# Patient Record
Sex: Female | Born: 1960 | Race: White | Hispanic: No | Marital: Single | Smoking: Current every day smoker
Health system: Southern US, Community
[De-identification: ages and names within clinical notes are randomized; demographics above are authoritative.]

## PROBLEM LIST (undated history)

## (undated) DIAGNOSIS — R739 Hyperglycemia, unspecified: Secondary | ICD-10-CM

## (undated) DIAGNOSIS — R0602 Shortness of breath: Secondary | ICD-10-CM

## (undated) DIAGNOSIS — E7800 Pure hypercholesterolemia, unspecified: Secondary | ICD-10-CM

## (undated) DIAGNOSIS — J40 Bronchitis, not specified as acute or chronic: Secondary | ICD-10-CM

## (undated) DIAGNOSIS — J449 Chronic obstructive pulmonary disease, unspecified: Secondary | ICD-10-CM

## (undated) HISTORY — DX: Shortness of breath: R06.02

## (undated) HISTORY — DX: Pure hypercholesterolemia, unspecified: E78.00

## (undated) HISTORY — DX: Hyperglycemia, unspecified: R73.9

## (undated) HISTORY — PX: SHOULDER SURGERY: SHX246

---
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
## 1998-05-22 ENCOUNTER — Other Ambulatory Visit: Admission: RE | Admit: 1998-05-22 | Discharge: 1998-05-22 | Payer: Self-pay | Admitting: Orthopaedic Surgery

## 1999-02-16 ENCOUNTER — Encounter: Payer: Self-pay | Admitting: Internal Medicine

## 1999-02-16 ENCOUNTER — Ambulatory Visit (HOSPITAL_COMMUNITY): Admission: RE | Admit: 1999-02-16 | Discharge: 1999-02-16 | Payer: Self-pay | Admitting: Internal Medicine

## 1999-02-17 ENCOUNTER — Ambulatory Visit (HOSPITAL_COMMUNITY): Admission: RE | Admit: 1999-02-17 | Discharge: 1999-02-17 | Payer: Self-pay | Admitting: Internal Medicine

## 1999-07-06 ENCOUNTER — Other Ambulatory Visit: Admission: RE | Admit: 1999-07-06 | Discharge: 1999-07-06 | Payer: Self-pay | Admitting: Obstetrics and Gynecology

## 2001-12-21 ENCOUNTER — Other Ambulatory Visit: Admission: RE | Admit: 2001-12-21 | Discharge: 2001-12-21 | Payer: Self-pay | Admitting: Obstetrics and Gynecology

## 2002-12-21 ENCOUNTER — Encounter: Admission: RE | Admit: 2002-12-21 | Discharge: 2002-12-21 | Payer: Self-pay | Admitting: Family Medicine

## 2002-12-21 ENCOUNTER — Encounter: Payer: Self-pay | Admitting: Family Medicine

## 2003-01-23 ENCOUNTER — Other Ambulatory Visit: Admission: RE | Admit: 2003-01-23 | Discharge: 2003-01-23 | Payer: Self-pay | Admitting: Obstetrics and Gynecology

## 2003-04-27 HISTORY — PX: COLONOSCOPY: SHX174

## 2004-01-27 ENCOUNTER — Other Ambulatory Visit: Admission: RE | Admit: 2004-01-27 | Discharge: 2004-01-27 | Payer: Self-pay | Admitting: Obstetrics and Gynecology

## 2004-03-03 ENCOUNTER — Ambulatory Visit: Payer: Self-pay | Admitting: Family Medicine

## 2004-03-05 ENCOUNTER — Ambulatory Visit: Payer: Self-pay | Admitting: Family Medicine

## 2004-03-09 ENCOUNTER — Encounter: Admission: RE | Admit: 2004-03-09 | Discharge: 2004-03-09 | Payer: Self-pay | Admitting: Family Medicine

## 2004-08-12 ENCOUNTER — Ambulatory Visit: Payer: Self-pay | Admitting: Internal Medicine

## 2004-08-31 ENCOUNTER — Ambulatory Visit: Payer: Self-pay | Admitting: Internal Medicine

## 2004-09-02 ENCOUNTER — Inpatient Hospital Stay (HOSPITAL_COMMUNITY): Admission: EM | Admit: 2004-09-02 | Discharge: 2004-09-12 | Payer: Self-pay | Admitting: Emergency Medicine

## 2004-09-03 ENCOUNTER — Encounter (INDEPENDENT_AMBULATORY_CARE_PROVIDER_SITE_OTHER): Payer: Self-pay | Admitting: Specialist

## 2004-09-03 ENCOUNTER — Ambulatory Visit: Payer: Self-pay | Admitting: Internal Medicine

## 2005-02-12 ENCOUNTER — Encounter (INDEPENDENT_AMBULATORY_CARE_PROVIDER_SITE_OTHER): Payer: Self-pay | Admitting: Specialist

## 2005-02-12 ENCOUNTER — Inpatient Hospital Stay (HOSPITAL_COMMUNITY): Admission: RE | Admit: 2005-02-12 | Discharge: 2005-02-17 | Payer: Self-pay | Admitting: General Surgery

## 2005-04-13 ENCOUNTER — Other Ambulatory Visit: Admission: RE | Admit: 2005-04-13 | Discharge: 2005-04-13 | Payer: Self-pay | Admitting: Obstetrics and Gynecology

## 2005-06-08 ENCOUNTER — Encounter: Admission: RE | Admit: 2005-06-08 | Discharge: 2005-06-08 | Payer: Self-pay | Admitting: General Surgery

## 2005-06-14 ENCOUNTER — Encounter (INDEPENDENT_AMBULATORY_CARE_PROVIDER_SITE_OTHER): Payer: Self-pay | Admitting: *Deleted

## 2005-06-14 ENCOUNTER — Other Ambulatory Visit: Admission: RE | Admit: 2005-06-14 | Discharge: 2005-06-14 | Payer: Self-pay | Admitting: Interventional Radiology

## 2005-06-14 ENCOUNTER — Encounter: Admission: RE | Admit: 2005-06-14 | Discharge: 2005-06-14 | Payer: Self-pay | Admitting: General Surgery

## 2006-06-28 ENCOUNTER — Ambulatory Visit (HOSPITAL_COMMUNITY): Admission: RE | Admit: 2006-06-28 | Discharge: 2006-06-29 | Payer: Self-pay | Admitting: General Surgery

## 2006-06-28 ENCOUNTER — Encounter (INDEPENDENT_AMBULATORY_CARE_PROVIDER_SITE_OTHER): Payer: Self-pay | Admitting: Specialist

## 2007-11-06 ENCOUNTER — Encounter: Admission: RE | Admit: 2007-11-06 | Discharge: 2007-11-06 | Payer: Self-pay | Admitting: General Surgery

## 2008-03-11 DIAGNOSIS — J069 Acute upper respiratory infection, unspecified: Secondary | ICD-10-CM | POA: Insufficient documentation

## 2008-03-13 ENCOUNTER — Ambulatory Visit: Payer: Self-pay | Admitting: Family Medicine

## 2008-03-13 DIAGNOSIS — F17200 Nicotine dependence, unspecified, uncomplicated: Secondary | ICD-10-CM | POA: Insufficient documentation

## 2008-03-13 DIAGNOSIS — J45909 Unspecified asthma, uncomplicated: Secondary | ICD-10-CM | POA: Insufficient documentation

## 2008-03-13 LAB — CONVERTED CEMR LAB
ALT: 20 units/L (ref 0–35)
Albumin: 3.8 g/dL (ref 3.5–5.2)
Alkaline Phosphatase: 78 units/L (ref 39–117)
Basophils Absolute: 0 10*3/uL (ref 0.0–0.1)
Calcium: 10 mg/dL (ref 8.4–10.5)
Chloride: 107 meq/L (ref 96–112)
Cholesterol: 279 mg/dL (ref 0–200)
Creatinine, Ser: 1 mg/dL (ref 0.4–1.2)
Eosinophils Absolute: 0.1 10*3/uL (ref 0.0–0.7)
GFR calc non Af Amer: 63 mL/min
Glucose, Bld: 107 mg/dL — ABNORMAL HIGH (ref 70–99)
HCT: 41.6 % (ref 36.0–46.0)
HDL: 67.4 mg/dL (ref >39.0)
MCHC: 34.5 g/dL (ref 30.0–36.0)
Monocytes Absolute: 0.7 10*3/uL (ref 0.1–1.0)
Monocytes Relative: 6.3 % (ref 3.0–12.0)
Sodium: 146 meq/L — ABNORMAL HIGH (ref 135–145)
TSH: 1.4 microintl units/mL (ref 0.35–5.50)
Total Bilirubin: 0.5 mg/dL (ref 0.3–1.2)
Total CHOL/HDL Ratio: 4.1
Triglycerides: 152 mg/dL — ABNORMAL HIGH (ref 0–149)
VLDL: 30 mg/dL (ref 0–40)
WBC: 10.4 10*3/uL (ref 4.5–10.5)

## 2008-03-14 ENCOUNTER — Telehealth: Payer: Self-pay | Admitting: Family Medicine

## 2008-03-27 ENCOUNTER — Other Ambulatory Visit: Admission: RE | Admit: 2008-03-27 | Discharge: 2008-03-27 | Payer: Self-pay | Admitting: Family Medicine

## 2008-03-27 ENCOUNTER — Encounter: Payer: Self-pay | Admitting: Family Medicine

## 2008-03-27 ENCOUNTER — Ambulatory Visit: Payer: Self-pay | Admitting: Family Medicine

## 2008-03-27 DIAGNOSIS — N951 Menopausal and female climacteric states: Secondary | ICD-10-CM | POA: Insufficient documentation

## 2008-04-04 ENCOUNTER — Encounter: Admission: RE | Admit: 2008-04-04 | Discharge: 2008-04-04 | Payer: Self-pay | Admitting: Family Medicine

## 2008-10-04 ENCOUNTER — Ambulatory Visit: Payer: Self-pay | Admitting: Family Medicine

## 2008-10-04 ENCOUNTER — Telehealth: Payer: Self-pay | Admitting: Internal Medicine

## 2008-10-04 DIAGNOSIS — K210 Gastro-esophageal reflux disease with esophagitis: Secondary | ICD-10-CM

## 2009-08-22 ENCOUNTER — Encounter: Admission: RE | Admit: 2009-08-22 | Discharge: 2009-08-22 | Payer: Self-pay | Admitting: Internal Medicine

## 2009-08-29 ENCOUNTER — Encounter: Admission: RE | Admit: 2009-08-29 | Discharge: 2009-08-29 | Payer: Self-pay | Admitting: Internal Medicine

## 2009-09-17 ENCOUNTER — Encounter (INDEPENDENT_AMBULATORY_CARE_PROVIDER_SITE_OTHER): Payer: Self-pay | Admitting: *Deleted

## 2010-05-28 NOTE — Letter (Signed)
Summary: Colonoscopy-Changed to Office Visit Letter  [facility]  [address] [place], [place] [address]   Phone: [phone]  Fax: [phone]      Sep 17, 2009 MRN: [id number]   [name] [address] [place], [place]  [address]   Dear Ms. Krotzer,   According to our records, it is time for you to schedule a Colonoscopy. However, after reviewing your medical record, I feel that an office visit would be most appropriate to more completely evaluate you and determine your need for a repeat procedure.  Please call [phone] (option #2) at your convenience to schedule an office visit. If you have any questions, concerns, or feel that this letter is in error, we would appreciate your call.   Sincerely,  Iva Boop, M.D.  [facility] [phone]

## 2010-09-11 NOTE — Op Note (Signed)
[name], Arias NO.:  [id number]   MEDICAL RECORD NO.:  [id number]          PATIENT TYPE:  INP   LOCATION:  0355                         FACILITY:  [facility]   PHYSICIAN:  Adolph Pollack, M.D.DATE OF BIRTH:  24-Jul-1960   DATE OF PROCEDURE:  09/03/2004  DATE OF DISCHARGE:                                 OPERATIVE REPORT   PREOPERATIVE DIAGNOSIS:  Perforated colon, post colonoscopy.   POSTOPERATIVE DIAGNOSIS:  Perforated sigmoid colon.   OPERATION/PROCEDURE:  1.  Exploratory laparotomy.  2.  Partial colectomy (sigmoid colon).  3.  Colostomy.   SURGEON:  Adolph Pollack, M.D.   ASSISTANT:  Angelia Mould. Derrell Lolling, M.D.   ANESTHESIA:  General endotracheal anesthesia.   INDICATIONS:  Sheena Arias is a 50 year old female who underwent  colonoscopy approximately three days ago with a sigmoid polypectomy.  She  began having a little crampy pain Tuesday and had a bowel movement Tuesday  night.  The pain escalated Wednesday and was quite severe.  She presented to  the emergency room and was admitted Wednesday evening.  CT scan was done in  the evening.  It demonstrates some extraluminal air and a complex abscess  cavity, potentially consistent with fecal material.  She is developing  worsening abdominal symptoms and elevation of white count.  She is now  brought to the operating room for emergency exploratory laparotomy.  The  procedure and risks were discussed with her preoperatively.   DESCRIPTION OF PROCEDURE:  She was seen in the holding area and brought to  the operating room, placed supine on the operating table and a general  anesthesia was administered.  Foley catheter was placed in the bladder.  The  abdominal wall was sterilely prepped and draped.  A subumbilical midline  incision was made through the skin and subcutaneous tissue, fascia and  peritoneum.  The peritoneal cavity was entered.  Once the peritoneal cavity  was entered, cloudy fluid was  noted and sent for culture.  This was coming  out of the pelvis.  I then mobilized part of the small bowel and found that  there was a large perforation, approximately 50% circumferential, in the  sigmoid colon with heavy stool contamination contained in the pelvis.  I  mobilized the sigmoid colon, both proximally and distally.  I then divided  the sigmoid colon proximally and distally to the perforation with stapler.  The mesentery was divided between vessels.  Dissection was kept above the  plane of the ureter.  Specimen was handed off the field.  Following this, I  copiously irrigated out the abdominal cavity and removed stool and debris.  Again, it was confined to the pelvic region.  Some of the small bowel had  helped wall it off.  I debrided part of the stool and some of the fibrinous  debris from the small bowel.  There is no small bowel perforation present.   After this, gloves were changed.  The area was inspected again and I did not  see any further feculent debris present.  Using a 2-0 Prolene suture, I  anchored the rectal  stump to the posterior abdominal wall.  I subsequently  made a circular incision in the left lower quadrant through the skin and  subcutaneous tissue.  A cruciate incision was made in the anterior and  posterior fascial layers.  The proximal descending colon stump was then  brought up through this circular incision and fascial defect for colostomy  placement and anchored the anterior aspect of the colon to the fascia with  interrupted 2-0 Vicryl sutures.   Sponge, needle and instrument counts were reported to be correct.  I took  the omentum and placed it over the intestine.  The fascia was then closed  with a running #1 PDS suture.  I left the subcutaneous tissue open mostly,  putting a few staples in the mid portion of the wound and at the proximal  and distal end and packed it with moist gauze.  The colostomy was then  matured with interrupted 3-0 Vicryl  sutures.  Colostomy appliance was  applied.  A bulky dry dressing was applied over the midline wound.   She tolerated the procedure fairly well without any apparent complications.  She subsequently was taken to the recovery room in satisfactory condition.      TJR/MEDQ  D:  09/03/2004  T:  09/03/2004  Job:  [id number]   cc:   Lina Sar, M.D. [facility]. Tawanna Cooler, M.D. [facility]

## 2010-09-11 NOTE — H&P (Signed)
NAME:  Sheena Arias, Sheena Arias NO.:  [id number]   MEDICAL RECORD NO.:  [id number]          PATIENT TYPE:  EMS   LOCATION:  ED                           FACILITY:  [facility]   PHYSICIAN:  Lina Sar, M.D. [facility]  DATE OF BIRTH:  July 16, 1960   DATE OF ADMISSION:  09/02/2004  DATE OF DISCHARGE:                                HISTORY & PHYSICAL   CHIEF COMPLAINT:  Severe abdominal pain.   HISTORY:  Taila is a pleasant 50 year old white female with history of colon  polyps and family history of colon cancer who is otherwise in generally good  health.  She has had no prior abdominal surgery.  She is status post right  shoulder repair and right foot surgery.   Patient underwent follow-up colonoscopy with Dr. Juanda Chance on Aug 31, 2004 and  had a small sigmoid colon polyp removed via hot biopsy.  She did fine with  the procedure and felt fine post procedure.  She had no complaints of  abdominal pain yesterday, had gone to work, eaten, etc.  Says she had some  very minimal crampy discomfort which she expected.  This morning after she  got up she developed acute severe lower abdominal pain and in the suprapubic  area and left lower quadrant which doubled her over.  This pain persisted.  Is described as severe 10/10, is associated with nausea and apparently  vomiting x1.  She has not had any fever or chills, no melena or hematochezia  or bowel movement this morning.  She was advised to present to the emergency  room for further evaluation.  She is hemodynamically stable, still having  severe pain which she describes as constant, gripey, and crampy in nature.  Her KUB shows a small bowel ileus, no free air.  WBC is 10.2, H&H 13.5 and  38.9, platelets 234.  Sodium 138, potassium 3.3, creatinine 0.9, LFTs  normal.  UA is pending.  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   CURRENT MEDICATIONS:  None on a  regular basis.   ALLERGIES:  IODINE and POTASSIUM IODIDE with itching and congestion.   PAST MEDICAL HISTORY:  As outlined above.   FAMILY HISTORY:  Mother deceased with colon cancer.   SOCIAL HISTORY:  The patient is married.  She is employed.  She is a smoker  one pack per day.  No regular ETOH.   REVIEW OF SYSTEMS:  CARDIOVASCULAR:  Denies any chest pain or anginal  symptoms.  PULMONARY:  Denies any cough, shortness of breath, or sputum  production.  GENITOURINARY:  She does describe pressure with urination this  morning.  She has no history of ureterolithiasis.  No hematuria noted.  GI:  As above.   PHYSICAL EXAMINATION:  GENERAL:  Well-developed white female in pain.  She  is quite uncomfortable.  She is alert and oriented x3.  VITAL SIGNS:  Temperature 96.9, blood pressure 127/85, pulse in the 90s,  respirations 16, saturations 99 on room air.  HEENT:  Normocephalic, atraumatic.  EOMI.  PERRLA.  Sclerae anicteric.  CARDIOVASCULAR:  Regular rate and rhythm with S1 and S2.  No murmurs, rubs,  or gallops.  PULMONARY:  Clear to A&P.  ABDOMEN:  Tender exquisitely in the left lower quadrant and suprapubic  region with guarding and early rebound.  She is minimally distended.  Bowel  sounds are active.  There is no mass or hepatosplenomegaly.  RECTAL:  Not done at this time.  EXTREMITIES:  Without clubbing, cyanosis, edema.  Pulses are intact.  NEUROLOGIC:  Nonfocal.   IMPRESSION:  A 50 year old white female with acute severe abdominal pain 48  hours status post colonoscopy and polypectomy.  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  Rule out focal perforation.  Rule out  ureterolithiasis.   PLAN:  Patient is admitted to the service of Dr. Lina Sar for IV fluid  hydration, pain control, CT scan of the abdomen and pelvis now.  She will be  kept at bowel rest.  Cover her with IV Unasyn.  For further details please  see the orders.      AE/MEDQ  D:   09/02/2004  T:  09/02/2004  Job:  [id number]

## 2010-09-11 NOTE — Discharge Summary (Signed)
Sheena Arias, Sheena Arias              ACCOUNT NO.:  [id number]   MEDICAL RECORD NO.:  [id number]          PATIENT TYPE:  INP   LOCATION:  1617                         FACILITY:  [facility]   PHYSICIAN:  Adolph Pollack, M.D.DATE OF BIRTH:  1961/02/21   DATE OF ADMISSION:  02/12/2005  DATE OF DISCHARGE:  02/17/2005                                 DISCHARGE SUMMARY   PRINCIPLE DISCHARGE DIAGNOSIS:  Colostomy.   SECONDARY DIAGNOSES:  1.  Gastroesophageal reflux disease.  2.  Mild postoperative ileus.   PROCEDURE:  Laparoscopic-assisted colostomy closure.   INDICATIONS:  This 50 year old female had an emergency partial colectomy and  colostomy for perforated colon.  She was recovering completely and now  presents for colostomy closure.   HOSPITAL COURSE:  She underwent the laparoscopic-assisted segmental  colectomy and colostomy on February 12, 2005 and tolerated this fairly well.  She had a little bit of nausea and a mild postoperative ileus; however, she  began passing gas in her third postoperative day.  Her diet was advanced.  Bowel are moving better.  By her fifth postoperative day, she was having  stable GI function.  The wounds looked good.  She was able to be discharged.   DISPOSITION:  Discharge to home on February 17, 2005.  The trocar site and  staples were removed.  Steri-Strips were applied.  Staples were left in the  left-sided colostomy closure site.  She was given activity restrictions and  Tylox for pain.  Will come back and see me in the office in about one week.      Adolph Pollack, M.D.  Electronically Signed     TJR/MEDQ  D:  02/24/2005  T:  02/24/2005  Job:  [id number]   cc:   Lina Sar, M.D. [facility]  [address]  [place]  [place] [address]

## 2010-09-11 NOTE — Op Note (Signed)
NAME:  Sheena Arias, MATZEN              ACCOUNT NO.:  [id number]   MEDICAL RECORD NO.:  [id number]          PATIENT TYPE:  INP   LOCATION:  0001                         FACILITY:  [facility]   PHYSICIAN:  Adolph Pollack, M.D.DATE OF BIRTH:  1960-05-05   DATE OF PROCEDURE:  02/12/2005  DATE OF DISCHARGE:                                 OPERATIVE REPORT   PREOPERATIVE DIAGNOSIS:  Colostomy.   POSTOPERATIVE DIAGNOSIS:  Colostomy.   PROCEDURE:  Laparoscopic-assisted segmental colectomy and colostomy closure.   SURGEON:  Adolph Pollack, M.D.   ASSISTANT:  Thornton Park. Daphine Deutscher, MD   ANESTHESIA:  General.   INDICATIONS:  Sheena Arias is a 50 year old female status post emergency  sigmoid colectomy and colostomy for sigmoid colon perforation. This was back  in May of this year. She has recovered completely, her wounds have healed.  She is at normal strength now and she presents for colostomy closure. The  procedure and the risks were discussed with her preoperatively.   TECHNIQUE:  She was seen in the holding area and then brought to the  operating room, placed supine on the operating table and a general  anesthetic was administered. A Foley catheter was placed in the bladder. She  was placed in lithotomy position. The abdominal wall, perineum and colostomy  were then sterilely prepped and draped. A Betadine-soaked sponge was placed  on the colostomy followed by an occlusive dressing. A small incision was  made in the right upper quadrant and using a 10 mm Optiview port the  peritoneal cavity was entered. The CO2 gas was insufflated creating a  pneumoperitoneum. The laparoscope was then reduced. I saw no underlying  visceral injury underneath the trocar and no bleeding was noted. Adhesions  between the omentum and anterior abdominal wall were noted. A 10 mm trocar  was placed in the right lower quadrant and a  5 mm trocar placed in the  right mid abdomen. Using the harmonic scalpel, I  lysed adhesions between the  omentum and anterior abdominal wall and was able to expose the colostomy.  Some small bowel adhesions to the colostomy area and the descending colon  were then divided sharply dropping the small bowel down into the peritoneal  cavity. I then identified suture that marked the distal sigmoid/colon/rectal  stump. There were some small bowel adhesions to this which I divided  sharply. It was fairly adherent to the pelvic sidewall. However, the  colostomy and the colonic stump were fairly close together. At this time, I  desufflated the abdomen. I then made an elliptical incision with a lateral  extension around the colostomy, divided the subcutaneous tissue down to the  fascia and then dissected the colostomy and the segment of colon free from  the fascia. I was able to mobilize quite a distance of it. I then directed  my attention to the rectal/sigmoid colon stump. I divided some adhesions  medially. It was fairly stuck to the pelvic sidewall and I carefully lysed  adhesions here. The gonadal vessels were adherent to the side of the  colostomy and I dissected them on the  colon and clipped and divided them. I  kept my dissection above the plane of the ureter. I noticed a small seroma  that was present, it was somewhat gelatinous in the area and I evacuated  this. This allowed for enough mobilization of the stump to perform an end-to-  end hand sewn anastomosis without tension. I performed a segmental colectomy  by sharply dividing the colon and dividing the mesentery between clamps and  ligating the vessels and handed this specimen off the field.   An end-to-end hand sewn anastomosis was performed in a single layer with  interrupted silk sutures, inverting the anastomosis. The anastomosis was  patent, viable, and under no tension. I then irrigated out the abdominal  cavity and the fluid returned clear without evidence of bleeding. I applied  Tisseel to the  anastomosis and let it dry and I placed the anastomosis back  into the abdominal cavity. I was told that needle, sponge and instrument  counts were correct. I then closed the colostomy site fascia in two layers  closing the posterior fascial layer with running #1 PDS suture and the  anterior fascial layer with #1 PDS suture. I irrigated out the subcutaneous  tissue. I then loosely closed the skin with staples and placed a little  Telfa gauze in between the staples.   I then reinsufflated the abdomen and inspected the area and noted no  bleeding or enteric leakage. I removed the trocars in the right abdomen  area. I then closed these incisions with staples. Sterile dressings were  applied. She tolerated the procedure well without any apparent  complications. Estimated blood loss was about 200 mL. She subsequently was  taken to the recovery in satisfactory condition.      Adolph Pollack, M.D.  Electronically Signed     TJR/MEDQ  D:  02/12/2005  T:  02/12/2005  Job:  [id number]   cc:   Lina Sar, M.D. [facility]  [address]  [place]  [place] [address]   Tinnie Gens A. Tawanna Cooler, M.D. [facility]  [address] [place]  [place] [address]

## 2010-09-11 NOTE — Consult Note (Signed)
NAMENORLENE, LANES NO.:  [id number]   MEDICAL RECORD NO.:  [id number]          PATIENT TYPE:  INP   LOCATION:  0355                         FACILITY:  [facility]   PHYSICIAN:  Adolph Pollack, M.D.DATE OF BIRTH:  [birth year]   DATE OF CONSULTATION:  09/03/2004  DATE OF DISCHARGE:                                   CONSULTATION   REASON FOR CONSULTATION:  Possible colonic perforation following  colonoscopy.   HISTORY OF PRESENT ILLNESS:  This is a 50 year old female, has a family  history of colon cancer and 3 years ago, had a colonoscopy with polypectomy.  She presents now for follow up colonoscopy and had a sigmoid polypectomy.  This was done 3 days ago.  Two days ago, she had some crampy left lower  quadrant pain, and yesterday she started having severe left lower quadrant  and suprapubic pain that progressively worsened.  She subsequently was  admitted to the hospital.  She had one episode of vomiting.  She has been  treated with IV antibiotics overnight, but her symptoms have worsened.  She  is more distended, and her white cell count is rising.  A CT scan was done  yesterday which demonstrated extraluminal area in the area of the sigmoid  colon as well as complex fluid mass, consistent with possible fecal  material.  Because of the concern of colonic perforation and her worsening  clinical status, I was asked to see her.  She does state that the pain is  getting worse and beginning to radiate more.   PAST MEDICAL HISTORY:  1.  Herniated disks of lumbar spine.  2.  Colonic polyps.   PAST SURGICAL HISTORY:  1.  Right shoulder surgery.  2.  Right foot surgery.   DRUG ALLERGIES:  IODINE AND POTASSIUM IODIDE.   HOME MEDICATIONS:  None.   SOCIAL HISTORY:  Married and employed.  She smokes a pack of cigarettes a  day.  No alcohol use.   FAMILY HISTORY:  Positive for a mother with colon cancer who died from it.   REVIEW OF SYSTEMS:  CONSTITUTIONAL:   Otherwise, in fairly good health until  she got ill at this time.  CARDIOVASCULAR:  Denies any hypertension, cardiac  disease.  PULMONARY:  Denies pneumonia, asthma, tuberculosis.  GI:  She had  a small bowel movement on Tuesday, and that is when the pain began actually  getting worse.  She is more distended now.  GU:  No known kidney stones.  ENDOCRINE:  No diabetes, thyroid disease, hypercholesterolemia.  NEUROLOGIC:  No strokes or seizures.  HEMATOLOGIC:  No bleeding disorders, blood clots.   PHYSICAL EXAMINATION:  GENERAL:  Generally an ill and uncomfortable-  appearing female.  VITAL SIGNS:  Temperature at 6:00 this morning was 97 degrees.  Blood  pressure 114/77.  Pulse is 95 and has been rising.  O2 saturations 96% on  room air.  EYES:  Extraocular muscles are intact, no icterus.  NECK:  Supple without masses or obvious thyroid enlargement.  RESPIRATORY:  Breath sounds equal and clear.  Respirations unlabored.  CARDIOVASCULAR:  Demonstrates  a regular rate and rhythm.  No murmur heard.  No lower extremity edema.  No JVD.  ABDOMEN:  Soft but distended.  There is lower abdominal tenderness and  guarding bilaterally to palpation and percussion.  No obvious masses.  No  hernias.  MUSCULOSKELETAL:  She has full range of motion.  She does walk a bit hunched  over, holding her abdomen.   LABORATORY REVIEW:  White cell count upon admission was 10,000.  It is now  16,300 this morning with a leftward shift.  Electrolytes within normal  limits except for a glucose of 149.  Liver function tests within normal  limits.   CT scan was reviewed and demonstrates an extraluminal air and appears to be  somewhat of a fluid collection that is complex and superior to the sigmoid  colon.   IMPRESSION:  Postcolonoscopy colonic perforation - Symptoms are worsening,  and she appears to have peritonitis and some evolving sepsis at this time.   PLAN:  To the operating room for emergency exploratory  laparotomy.  A  possible partial colectomy and colostomy.  I have discussed with her at  length the procedure rationale and risks.  Risks include but not limited to  bleeding, infection, wound healing problems, accidental damage intra-  abdominal organs; such as intestine, bladder, ureter, etc., need for  colostomy, and risk of anesthesia.  She seems to understand this and the  gravity of the situation and is agreeable to proceeding.      TJR/MEDQ  D:  09/03/2004  T:  09/03/2004  Job:  [id number]

## 2010-09-11 NOTE — Discharge Summary (Signed)
NAMEEARLYNE, FEESER              ACCOUNT NO.:  [id number]   MEDICAL RECORD NO.:  [id number]          PATIENT TYPE:  INP   LOCATION:  0355                         [facility]   PHYSICIAN:  Adolph Pollack, M.D.DATE OF BIRTH:  1960-06-09   DATE OF ADMISSION:  09/02/2004  DATE OF DISCHARGE:  09/12/2004                                 DISCHARGE SUMMARY   PRINCIPAL DISCHARGE DIAGNOSIS:  Sigmoid colon perforation following  colonoscopy.   SECONDARY DIAGNOSIS:  Postoperative ileus   PROCEDURES:  Emergent exploratory laparotomy, partial colectomy, and  colostomy.   REASON FOR ADMISSION:  This is a 50 year old female who had a colonoscopy on  May 8.  She began having some discomfort the day after in the evening, and  this increased. She subsequently presented to the emergency department for  evaluation.  She had a CT scan performed that demonstrated extra luminal air  in a fluid collection complex to the sigmoid colon.  She was admitted.   HOSPITAL COURSE:  She was taken to the operating room where she was found to  have a perforated sigmoid colon with fecal contamination.  She underwent a  partial colectomy with colostomy and Hartmann pouch, copious irrigation, and  was started on broad-spectrum antibiotics.  Postoperatively, she did have  some fever and was kept on broad-spectrum antibiotics.  Wound was left open,  and dressing changes were started.  She did have a postoperative ileus with  an NG tube that was in, and this was removed.  We started her on some sips  of liquids and slowly advanced her diet.  Colostomy teaching was begun.  The  wound was healing well, and the colonoscopy was viable and working well.  On  her seventh postop day, Zosyn was discontinued.  Her oral intake improved.  By Sep 12, 2004, she is getting more comfortable with the ostomy, although a  little anxious, and she is eating better.  Ostomy was functional, and she  was discharged.   DISPOSITION:   Discharged to home in satisfactory condition Sep 12, 2004.  Home health nurses will come help her change her dressing and will take care  of her ostomy.  She is given Tylox for pain.  She will come see me in one to  two weeks for followup appointment.      TJR/MEDQ  D:  09/23/2004  T:  09/23/2004  Job:  [id number]   cc:   Lina Sar, M.D. [facility]

## 2010-09-11 NOTE — Op Note (Signed)
NAME:  Sheena Arias, Sheena Arias NO.:  [id number]   MEDICAL RECORD NO.:  [id number]          PATIENT TYPE:  AMB   LOCATION:  DAY                          FACILITY:  [facility]   PHYSICIAN:  Adolph Pollack, M.D.DATE OF BIRTH:  1960/10/15   DATE OF PROCEDURE:  06/28/2006  DATE OF DISCHARGE:                               OPERATIVE REPORT   PREOPERATIVE DIAGNOSIS:  Right thyroid neoplasm.   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   PROCEDURE:  Right thyroid lobectomy.   SURGEON:  Adolph Pollack, M.D.   ASSISTANT:  Leonie Man, M.D.   ANESTHESIA:  General.   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  She is having increased size and now has some compressive symptoms.  She  now presents for right thyroid lobectomy, possible total thyroidectomy.  We have discussed procedure and risks preoperatively.   TECHNIQUE:  She is seen in the holding area and the right neck marked  with my initials.  She is then brought to the operating room, placed  supine on the operating table, and general anesthetic was administered.  The neck was then placed in extension carefully and the neck and upper  chest sterilely prepped and draped.  Approximately 1-2 fingerbreadths  above the clavicle a curvilinear incision was made through the skin,  subcutaneous tissue and platysma muscle.  Subplatysmal flaps were then  raised to the thyroid cartilage superiorly and the sternal notch  inferiorly.  The precervical fascia between the strap muscles was  divided.  The strap muscles were dissected free from the thyroid nodule  and thyroid gland using careful blunt dissection and electrocautery.   The inferior pole of the right thyroid gland was approached and with  dissection on the thyroid gland, the inferior vessels were identified at  the inferior pole.   They were clipped and divided, mobilizing the  inferior pole.  I then began moving centrally toward the mid gland,  carefully dissecting tissue and small vessels off the gland using clips  and identified the inferior parathyroid artery and swept this laterally.   Following this I then mobilized the superior pole.  The dissection on  the thyroid gland identified the superior pole vessels.  These were  dissected and then tied and divided.  I then approached the suspensory  ligament of Berry and the middle thyroidal vessels.  At this time I  identified the superior parathyroid gland and swept it medially.  I  identified the recurrent laryngeal nerve and kept my plane of dissection  anterior to this.  The suspensory ligament of Allyson Sabal was then divided  using a Harmonic scalpel.  The thyroid was mobilized by dissection on  the gland and small vessels divided between clips on the gland. This  allowed the middle thyroidal vessels to drop away as well as the  inferior thyroid artery branches and the nerve.  The parathyroid dropped  away laterally as well.   I then dissected the thyroid off the trachea and  divided the thyroid  isthmus with the Harmonic scalpel.  This specimen was marked and sent  for frozen section.  Frozen section was benign.   I reinspected the area.  I identified the nerve and it was not injured.  I irrigated the area and no bleeding was noted.  Surgicel was placed.   I reapproximated the strap muscles with interrupted 3-0 Vicryl sutures.  The subcutaneous tissue was irrigated and no bleeding was noted.  The  platysma muscle was reapproximated with interrupted 3-0 Vicryl sutures  and the skin closed with 4-0 Monocryl running subcuticular stitch.  Steri-Strips and sterile dressings were applied.   She tolerated the procedure without any apparent complications and was  taken to recovery in satisfactory condition.      Adolph Pollack, M.D.  Electronically  Signed     TJR/MEDQ  D:  06/28/2006  T:  06/28/2006  Job:  [id number]   cc:   Tinnie Gens A. Tawanna Cooler, MD  [address] [place]  [place] [address]

## 2010-09-30 ENCOUNTER — Other Ambulatory Visit: Payer: Self-pay | Admitting: Internal Medicine

## 2010-09-30 DIAGNOSIS — Z1231 Encounter for screening mammogram for malignant neoplasm of breast: Secondary | ICD-10-CM

## 2010-10-07 ENCOUNTER — Ambulatory Visit
Admission: RE | Admit: 2010-10-07 | Discharge: 2010-10-07 | Disposition: A | Discharge status: Home / Self Care | Payer: BC Managed Care – PPO | Source: Ambulatory Visit | Attending: Internal Medicine | Admitting: Internal Medicine

## 2010-10-07 DIAGNOSIS — Z1231 Encounter for screening mammogram for malignant neoplasm of breast: Secondary | ICD-10-CM

## 2011-11-09 ENCOUNTER — Other Ambulatory Visit: Payer: Self-pay | Admitting: Internal Medicine

## 2011-11-09 DIAGNOSIS — Z1231 Encounter for screening mammogram for malignant neoplasm of breast: Secondary | ICD-10-CM

## 2011-11-22 ENCOUNTER — Ambulatory Visit
Admission: RE | Admit: 2011-11-22 | Discharge: 2011-11-22 | Disposition: A | Discharge status: Home / Self Care | Payer: BC Managed Care – PPO | Source: Ambulatory Visit | Attending: Internal Medicine | Admitting: Internal Medicine

## 2011-11-22 DIAGNOSIS — Z1231 Encounter for screening mammogram for malignant neoplasm of breast: Secondary | ICD-10-CM

## 2013-01-02 ENCOUNTER — Other Ambulatory Visit: Payer: Self-pay

## 2013-01-02 DIAGNOSIS — Z1231 Encounter for screening mammogram for malignant neoplasm of breast: Secondary | ICD-10-CM

## 2013-01-22 ENCOUNTER — Ambulatory Visit
Admission: RE | Admit: 2013-01-22 | Discharge: 2013-01-22 | Disposition: A | Discharge status: Home / Self Care | Payer: No Typology Code available for payment source | Source: Ambulatory Visit

## 2013-01-22 DIAGNOSIS — Z1231 Encounter for screening mammogram for malignant neoplasm of breast: Secondary | ICD-10-CM

## 2014-04-23 ENCOUNTER — Other Ambulatory Visit: Payer: Self-pay

## 2014-04-23 DIAGNOSIS — Z1231 Encounter for screening mammogram for malignant neoplasm of breast: Secondary | ICD-10-CM

## 2014-04-26 HISTORY — PX: THYROIDECTOMY: SHX17

## 2014-05-03 ENCOUNTER — Ambulatory Visit
Admission: RE | Admit: 2014-05-03 | Discharge: 2014-05-03 | Disposition: A | Discharge status: Home / Self Care | Payer: No Typology Code available for payment source | Source: Ambulatory Visit

## 2014-05-03 DIAGNOSIS — Z1231 Encounter for screening mammogram for malignant neoplasm of breast: Secondary | ICD-10-CM

## 2015-06-06 ENCOUNTER — Other Ambulatory Visit: Payer: Self-pay

## 2015-06-06 DIAGNOSIS — Z1231 Encounter for screening mammogram for malignant neoplasm of breast: Secondary | ICD-10-CM

## 2015-06-23 ENCOUNTER — Ambulatory Visit
Admission: RE | Admit: 2015-06-23 | Discharge: 2015-06-23 | Disposition: A | Discharge status: Home / Self Care | Payer: BLUE CROSS/BLUE SHIELD | Source: Ambulatory Visit

## 2015-06-23 DIAGNOSIS — Z1231 Encounter for screening mammogram for malignant neoplasm of breast: Secondary | ICD-10-CM

## 2016-11-23 ENCOUNTER — Other Ambulatory Visit: Payer: Self-pay | Admitting: Internal Medicine

## 2016-11-23 DIAGNOSIS — Z1231 Encounter for screening mammogram for malignant neoplasm of breast: Secondary | ICD-10-CM

## 2016-11-29 ENCOUNTER — Ambulatory Visit
Admission: RE | Admit: 2016-11-29 | Discharge: 2016-11-29 | Disposition: A | Discharge status: Home / Self Care | Payer: BLUE CROSS/BLUE SHIELD | Source: Ambulatory Visit | Attending: Internal Medicine | Admitting: Internal Medicine

## 2016-11-29 DIAGNOSIS — Z1231 Encounter for screening mammogram for malignant neoplasm of breast: Secondary | ICD-10-CM

## 2016-12-14 ENCOUNTER — Other Ambulatory Visit: Payer: Self-pay | Admitting: Internal Medicine

## 2016-12-14 DIAGNOSIS — Z122 Encounter for screening for malignant neoplasm of respiratory organs: Secondary | ICD-10-CM

## 2016-12-20 ENCOUNTER — Ambulatory Visit
Admission: RE | Admit: 2016-12-20 | Discharge: 2016-12-20 | Disposition: A | Discharge status: Home / Self Care | Payer: 59 | Source: Ambulatory Visit | Attending: Internal Medicine | Admitting: Internal Medicine

## 2016-12-20 DIAGNOSIS — Z122 Encounter for screening for malignant neoplasm of respiratory organs: Secondary | ICD-10-CM

## 2017-12-22 ENCOUNTER — Other Ambulatory Visit: Payer: Self-pay | Admitting: Internal Medicine

## 2017-12-22 DIAGNOSIS — Z1231 Encounter for screening mammogram for malignant neoplasm of breast: Secondary | ICD-10-CM

## 2018-01-18 ENCOUNTER — Ambulatory Visit
Admission: RE | Admit: 2018-01-18 | Discharge: 2018-01-18 | Disposition: A | Discharge status: Home / Self Care | Payer: 59 | Source: Ambulatory Visit | Attending: Internal Medicine | Admitting: Internal Medicine

## 2018-01-18 DIAGNOSIS — Z1231 Encounter for screening mammogram for malignant neoplasm of breast: Secondary | ICD-10-CM

## 2020-04-23 IMAGING — MG DIGITAL SCREENING BILATERAL MAMMOGRAM WITH TOMO AND CAD
8 series · 8 of 24 positions shown · non-contrast
Comparison: Previous exam(s).

CLINICAL DATA: Screening.

EXAM:
DIGITAL SCREENING BILATERAL MAMMOGRAM WITH TOMO AND CAD

[L CC synth-2D]
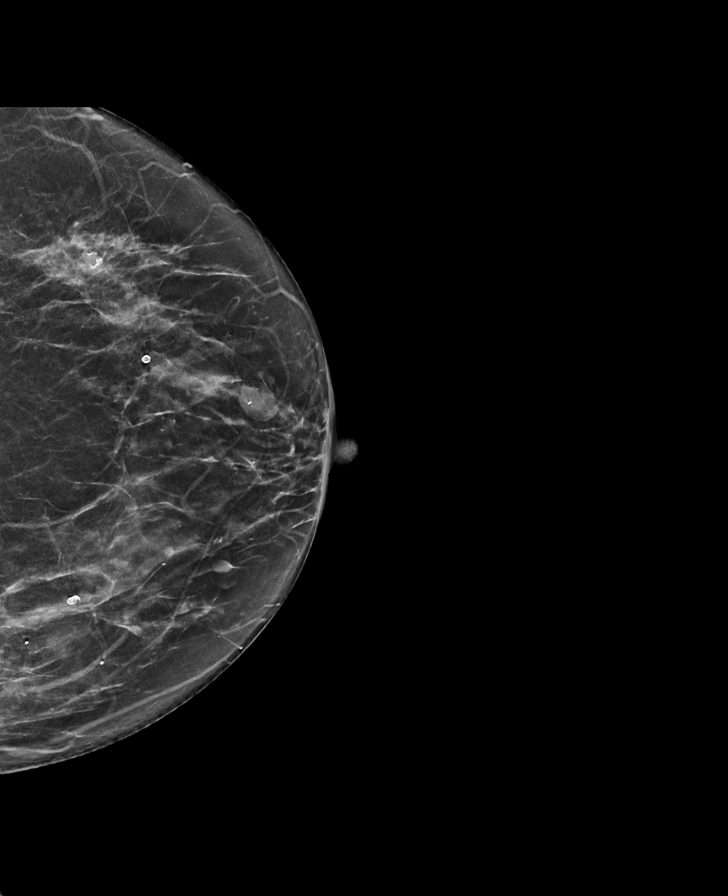

[R CC synth-2D]
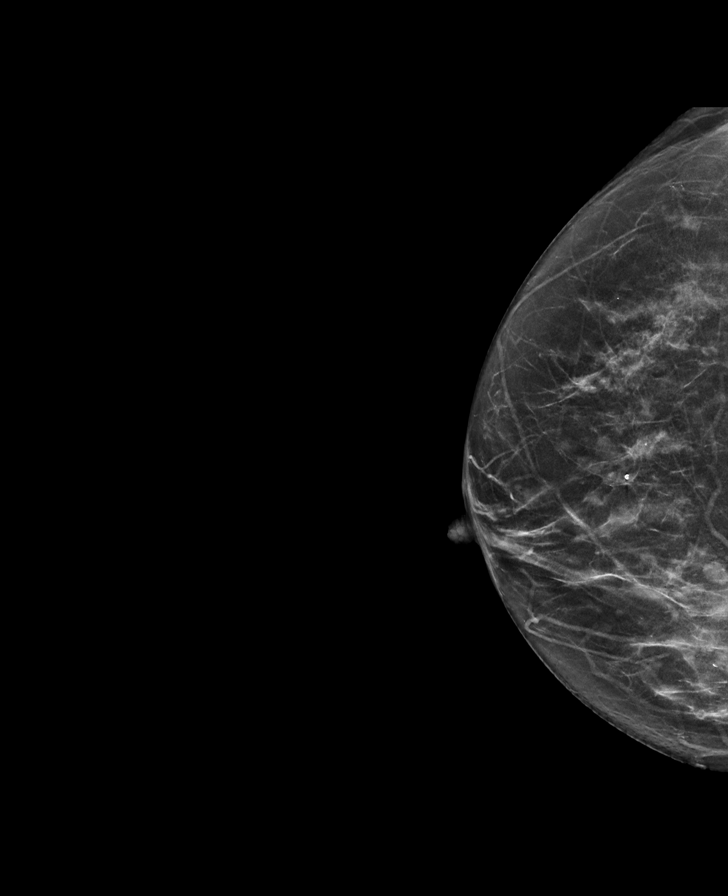

[R MLO synth-2D]
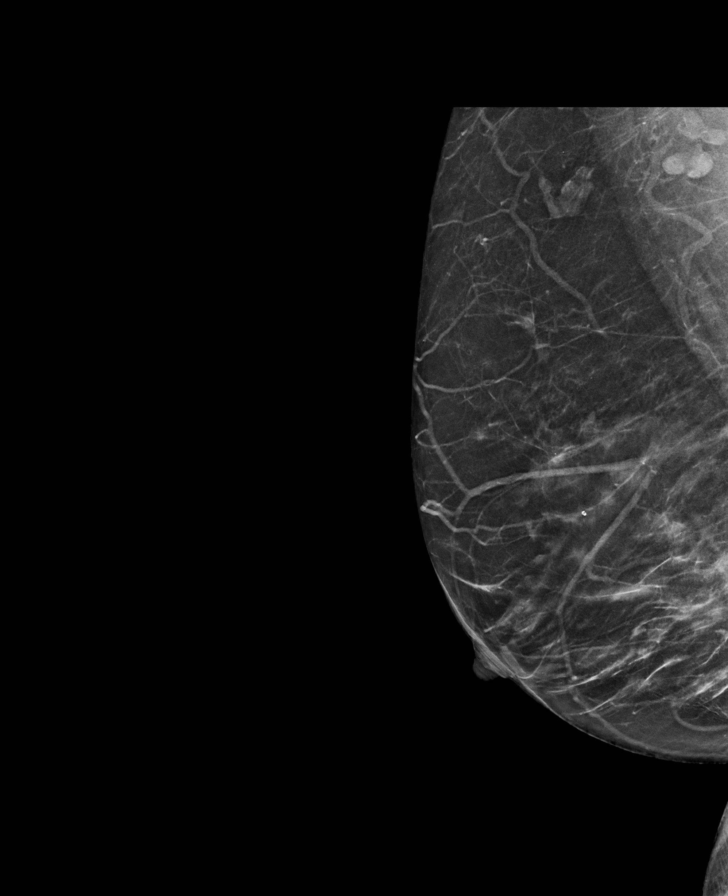

[L MLO synth-2D]
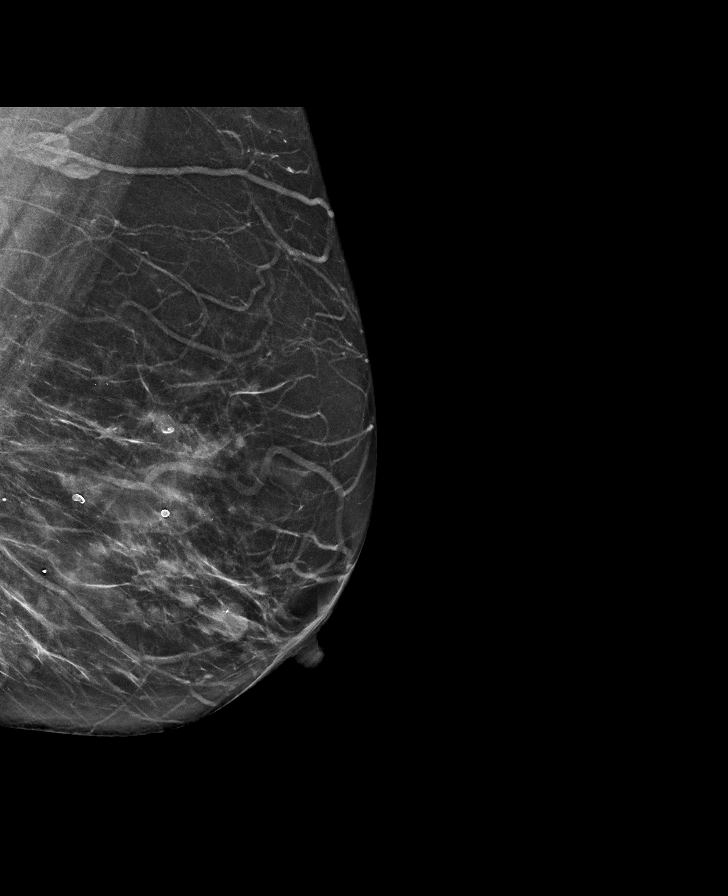

[L CC tomo · tomo slice 31/60.0]
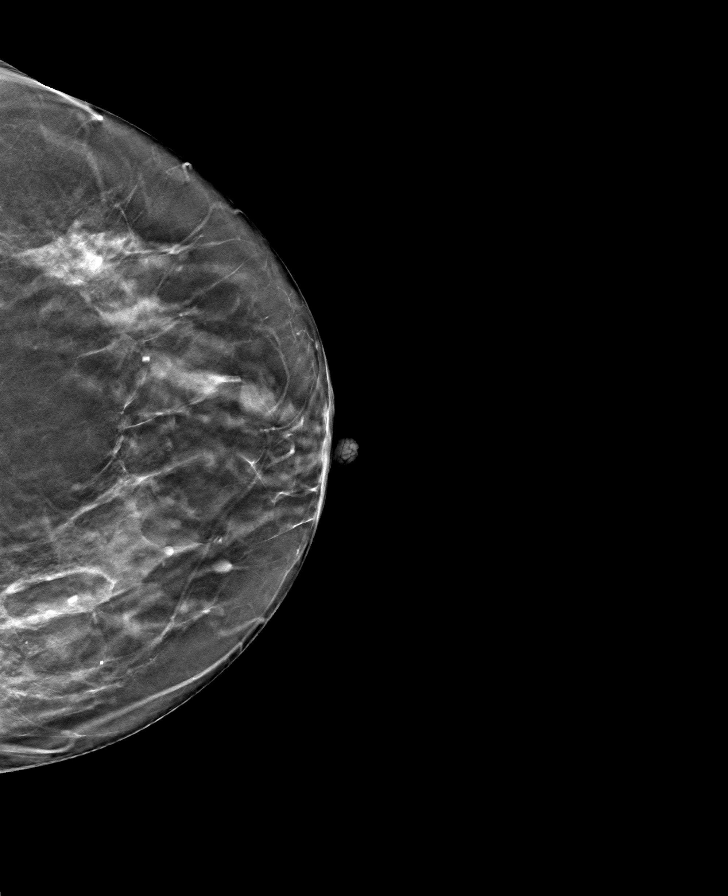

[R CC tomo · tomo slice 29/57.0]
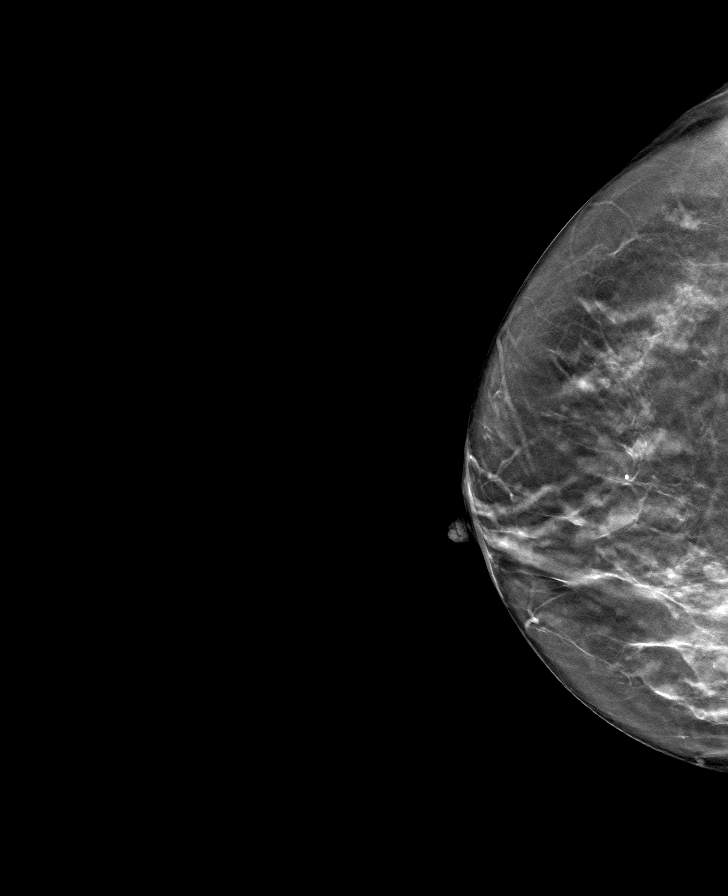

[L MLO tomo · tomo slice 33/66.0]
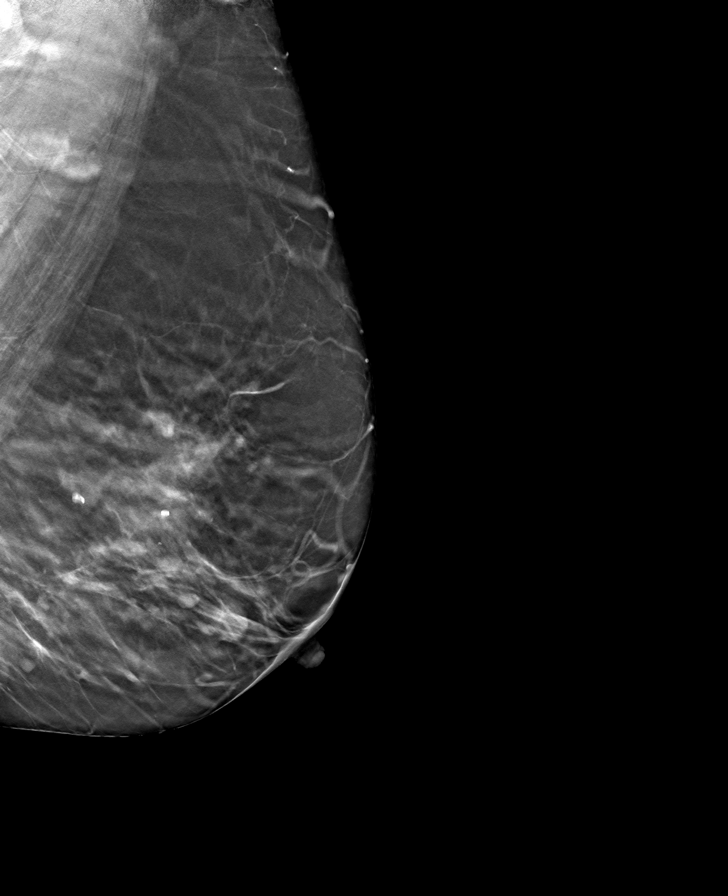

[R MLO tomo · tomo slice 29/56.0]
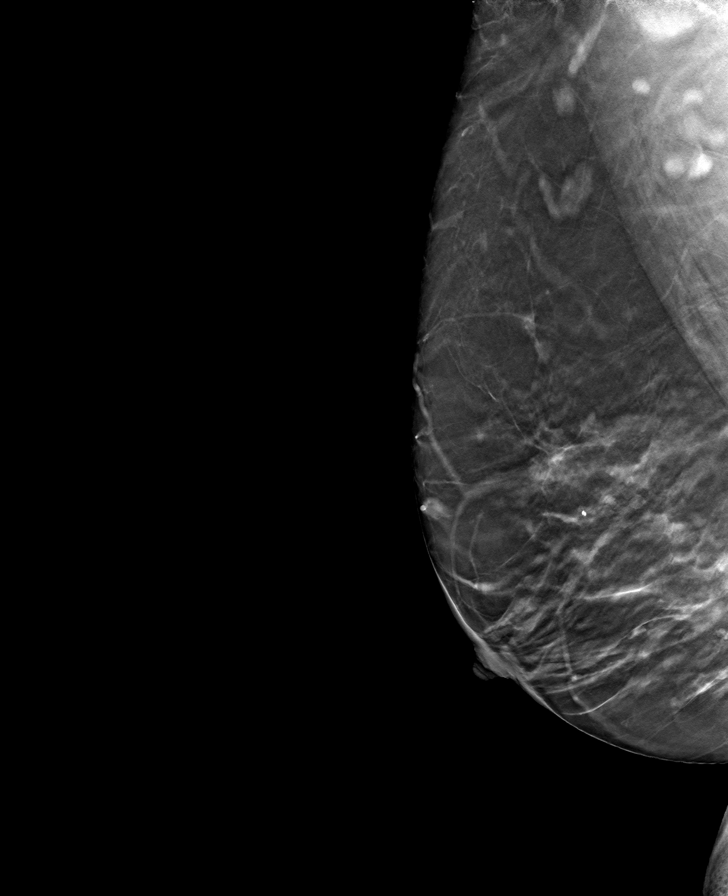

[8 of 24 positions shown; findings below may reference images not displayed]

ACR Breast Density Category c: The breast tissue is heterogeneously
dense, which may obscure small masses.
FINDINGS: There are no findings suspicious for malignancy. Images were
processed with CAD.
IMPRESSION: No mammographic evidence of malignancy. A result letter of this
screening mammogram will be mailed directly to the patient.

RECOMMENDATION:
Screening mammogram in one year. (Code:[id number])

BI-RADS CATEGORY  1: Negative.

## 2022-02-11 ENCOUNTER — Other Ambulatory Visit: Payer: Self-pay | Admitting: Internal Medicine

## 2022-02-11 DIAGNOSIS — Z1231 Encounter for screening mammogram for malignant neoplasm of breast: Secondary | ICD-10-CM

## 2022-03-31 ENCOUNTER — Ambulatory Visit
Admission: RE | Admit: 2022-03-31 | Discharge: 2022-03-31 | Disposition: A | Discharge status: Home / Self Care | Payer: 59 | Source: Ambulatory Visit | Attending: Internal Medicine | Admitting: Internal Medicine

## 2022-03-31 DIAGNOSIS — Z1231 Encounter for screening mammogram for malignant neoplasm of breast: Secondary | ICD-10-CM

## 2022-08-31 ENCOUNTER — Other Ambulatory Visit (HOSPITAL_BASED_OUTPATIENT_CLINIC_OR_DEPARTMENT_OTHER): Payer: Self-pay

## 2022-08-31 ENCOUNTER — Other Ambulatory Visit: Payer: Self-pay

## 2022-08-31 ENCOUNTER — Encounter (HOSPITAL_BASED_OUTPATIENT_CLINIC_OR_DEPARTMENT_OTHER): Payer: Self-pay

## 2022-08-31 ENCOUNTER — Emergency Department (HOSPITAL_BASED_OUTPATIENT_CLINIC_OR_DEPARTMENT_OTHER)
Admission: EM | Admit: 2022-08-31 | Discharge: 2022-08-31 | Disposition: A | Discharge status: Home / Self Care | Payer: 59 | Attending: Emergency Medicine | Admitting: Emergency Medicine

## 2022-08-31 ENCOUNTER — Emergency Department (HOSPITAL_BASED_OUTPATIENT_CLINIC_OR_DEPARTMENT_OTHER): Payer: 59

## 2022-08-31 DIAGNOSIS — R5383 Other fatigue: Secondary | ICD-10-CM | POA: Diagnosis present

## 2022-08-31 DIAGNOSIS — B349 Viral infection, unspecified: Secondary | ICD-10-CM

## 2022-08-31 DIAGNOSIS — J209 Acute bronchitis, unspecified: Secondary | ICD-10-CM | POA: Diagnosis not present

## 2022-08-31 DIAGNOSIS — Z87891 Personal history of nicotine dependence: Secondary | ICD-10-CM | POA: Diagnosis not present

## 2022-08-31 DIAGNOSIS — J40 Bronchitis, not specified as acute or chronic: Secondary | ICD-10-CM

## 2022-08-31 HISTORY — DX: Bronchitis, not specified as acute or chronic: J40

## 2022-08-31 LAB — COMPREHENSIVE METABOLIC PANEL
ALT: 12 U/L (ref 0–44)
AST: 11 U/L — ABNORMAL LOW (ref 15–41)
Albumin: 4.1 g/dL (ref 3.5–5.0)
Alkaline Phosphatase: 71 U/L (ref 38–126)
Anion gap: 10 (ref 5–15)
BUN: 30 mg/dL — ABNORMAL HIGH (ref 8–23)
CO2: 27 mmol/L (ref 22–32)
Calcium: 9.6 mg/dL (ref 8.9–10.3)
Chloride: 100 mmol/L (ref 98–111)
Creatinine, Ser: 1.82 mg/dL — ABNORMAL HIGH (ref 0.44–1.00)
GFR, Estimated: 31 mL/min — ABNORMAL LOW (ref >60)
Glucose, Bld: 111 mg/dL — ABNORMAL HIGH (ref 70–99)
Potassium: 4.3 mmol/L (ref 3.5–5.1)
Sodium: 137 mmol/L (ref 135–145)
Total Bilirubin: 0.4 mg/dL (ref 0.3–1.2)
Total Protein: 7.7 g/dL (ref 6.5–8.1)

## 2022-08-31 LAB — CBC WITH DIFFERENTIAL/PLATELET
Abs Immature Granulocytes: 0.03 10*3/uL (ref 0.00–0.07)
Basophils Absolute: 0 10*3/uL (ref 0.0–0.1)
Basophils Relative: 0 %
Eosinophils Absolute: 0.1 10*3/uL (ref 0.0–0.5)
Eosinophils Relative: 1 %
HCT: 35.9 % — ABNORMAL LOW (ref 36.0–46.0)
Hemoglobin: 11.9 g/dL — ABNORMAL LOW (ref 12.0–15.0)
Immature Granulocytes: 0 %
Lymphocytes Relative: 21 %
Lymphs Abs: 1.6 10*3/uL (ref 0.7–4.0)
MCH: 30.1 pg (ref 26.0–34.0)
MCHC: 33.1 g/dL (ref 30.0–36.0)
MCV: 90.7 fL (ref 80.0–100.0)
Monocytes Absolute: 0.5 10*3/uL (ref 0.1–1.0)
Monocytes Relative: 6 %
Neutro Abs: 5.4 10*3/uL (ref 1.7–7.7)
Neutrophils Relative %: 72 %
Platelets: 228 10*3/uL (ref 150–400)
RBC: 3.96 MIL/uL (ref 3.87–5.11)
RDW: 14.6 % (ref 11.5–15.5)
WBC: 7.5 10*3/uL (ref 4.0–10.5)
nRBC: 0 % (ref 0.0–0.2)

## 2022-08-31 LAB — MAGNESIUM: Magnesium: 2 mg/dL (ref 1.7–2.4)

## 2022-08-31 LAB — TROPONIN I (HIGH SENSITIVITY): Troponin I (High Sensitivity): 3 ng/L (ref <18)

## 2022-08-31 MED ORDER — IPRATROPIUM-ALBUTEROL 0.5-2.5 (3) MG/3ML IN SOLN
3.0000 mL | Freq: Once | RESPIRATORY_TRACT | Status: AC
  Administered 2022-08-31: 3 mL via RESPIRATORY_TRACT
  Filled 2022-08-31: qty 3

## 2022-08-31 MED ORDER — AZITHROMYCIN 250 MG PO TABS
250.0000 mg | ORAL_TABLET | Freq: Every day | ORAL | 0 refills | Status: DC
  Filled 2022-08-31: qty 6, 5d supply, fill #0

## 2022-08-31 MED ORDER — SODIUM CHLORIDE 0.9 % IV BOLUS
1000.0000 mL | Freq: Once | INTRAVENOUS | Status: AC
  Administered 2022-08-31: 1000 mL via INTRAVENOUS

## 2022-08-31 MED ORDER — DEXAMETHASONE 4 MG PO TABS
10.0000 mg | ORAL_TABLET | Freq: Once | ORAL | Status: AC
  Administered 2022-08-31: 10 mg via ORAL
  Filled 2022-08-31: qty 3

## 2022-08-31 MED ORDER — ALBUTEROL SULFATE (2.5 MG/3ML) 0.083% IN NEBU
2.5000 mg | INHALATION_SOLUTION | Freq: Once | RESPIRATORY_TRACT | Status: AC
  Administered 2022-08-31: 2.5 mg via RESPIRATORY_TRACT
  Filled 2022-08-31: qty 3

## 2022-08-31 MED ORDER — ONDANSETRON 4 MG PO TBDP
ORAL_TABLET | ORAL | 0 refills | Status: DC
  Filled 2022-08-31: qty 18, 21d supply, fill #0

## 2022-08-31 NOTE — Discharge Instructions (Signed)
You can use your rescue inhaler up to every 4 hours while you are awake.  If you have to use it more often then please return to the emergency department for evaluation.

## 2022-08-31 NOTE — ED Notes (Signed)
Pt given discharge instructions and reviewed prescriptions. Opportunities given for questions. Pt verbalizes understanding. PIV removed x1. Harlei Lehrmann R, RN 

## 2022-08-31 NOTE — ED Notes (Signed)
Patient educated on the use of an Albuterol inhaler versus Symbicort inhaler. Also education on the use of a spacer with the inhaler

## 2022-08-31 NOTE — ED Provider Notes (Signed)
[place] EM[facility]GENC[facility] DEPARTMENT AT [facility] Provider Note   CSN: [id number] Arrival date & time: 08/31/22  1058     History  Chief Complaint  Patient presents with   Shortness of Breath   Weakness    Sheena Arias is a 62 y.o. female.  62 yo F with a chief complaint of fatigue. Going on for the past week and a half or so.  Has had nausea and vomiting.  Went to see her family doctor and reportedly was hypotensive in the office.  Had had some trouble breathing and generalized fatigue and was eventually discharged home.  She feels like things have persisted.  She has a history of recurrent bronchitis.  History of smoking but no history of COPD.   Shortness of Breath Weakness Associated symptoms: shortness of breath        Home Medications Prior to Admission medications   Medication Sig Start Date End Date Taking? Authorizing Provider  atorvastatin (LIPITOR) 20 MG tablet Take 20 mg by mouth every evening. 08/14/22  Yes [provider]  azithromycin (ZITHROMAX) 250 MG tablet Take 1 tablet (250 mg total) by mouth daily. Take first 2 tablets together, then 1 every day until finished. 08/31/22  Yes Melene Plan, DO  lisinopril-hydrochlorothiazide (ZESTORETIC) 10-12.5 MG tablet Take 1 tablet by mouth daily. 08/14/22  Yes [provider]  metFORMIN (GLUCOPHAGE) 500 MG tablet Take 500 mg by mouth 2 (two) times daily. 08/14/22  Yes [provider]  omeprazole (PRILOSEC) 40 MG capsule Take 40 mg by mouth daily. 08/23/22  Yes [provider]  ondansetron (ZOFRAN-ODT) 4 MG disintegrating tablet 4mg  ODT q4 hours prn nausea/vomit 08/31/22  Yes Melene Plan, DO  promethazine (PHENERGAN) 25 MG tablet Take 25 mg by mouth every 6 (six) hours as needed.    [provider]      Allergies    Iohexol    Review of Systems   Review of Systems  Respiratory:  Positive for shortness of breath.   Neurological:  Positive for weakness.    Physical  Exam Updated Vital Signs BP 131/70   Pulse 86   Temp 98 F (36.7 C) (Oral)   Resp 17   SpO2 100%  Physical Exam Vitals and nursing note reviewed.  Constitutional:      General: She is not in acute distress.    Appearance: She is well-developed. She is not diaphoretic.  HENT:     Head: Normocephalic and atraumatic.     Comments: Swollen turbinates, posterior nasal drip, no noted sinus ttp, tm normal bilaterally.   Eyes:     Pupils: Pupils are equal, round, and reactive to light.  Cardiovascular:     Rate and Rhythm: Normal rate and regular rhythm.     Heart sounds: No murmur heard.    No friction rub. No gallop.  Pulmonary:     Effort: Pulmonary effort is normal.     Breath sounds: No wheezing or rales.     Comments: Transmitted inhalation noises.  Prolonged expiratory effort. Abdominal:     General: There is no distension.     Palpations: Abdomen is soft.     Tenderness: There is no abdominal tenderness.  Musculoskeletal:        General: No tenderness.     Cervical back: Normal range of motion and neck supple.  Skin:    General: Skin is warm and dry.  Neurological:     Mental Status: She is alert and oriented to  person, place, and time.  Psychiatric:        Behavior: Behavior normal.     ED Results / Procedures / Treatments   Labs (all labs ordered are listed, but only abnormal results are displayed) Labs Reviewed  CBC WITH DIFFERENTIAL/PLATELET - Abnormal; Notable for the following components:      Result Value   Hemoglobin 11.9 (*)    HCT 35.9 (*)    All other components within normal limits  COMPREHENSIVE METABOLIC PANEL - Abnormal; Notable for the following components:   Glucose, Bld 111 (*)    BUN 30 (*)    Creatinine, Ser 1.82 (*)    AST 11 (*)    GFR, Estimated 31 (*)    All other components within normal limits  MAGNESIUM  TROPONIN I (HIGH SENSITIVITY)    EKG None  Radiology DG Chest Port 1 View  Result Date: 08/31/2022 CLINICAL DATA:   Fatigue and shortness of breath EXAM: PORTABLE CHEST 1 VIEW COMPARISON:  CT chest dated 12/20/2016, chest radiograph dated 06/24/2006 FINDINGS: Normal lung volumes. No focal consolidations. No pleural effusion or pneumothorax. The heart size and mediastinal contours are within normal limits. No acute osseous abnormality. Surgical clips project over the right neck. IMPRESSION: No active disease. Electronically Signed   By: Agustin Cree M.D.   On: 08/31/2022 12:01    Procedures Procedures    Medications Ordered in ED Medications  ipratropium-albuterol (DUONEB) 0.5-2.5 (3) MG/3ML nebulizer solution 3 mL (3 mLs Nebulization Given 08/31/22 1127)  sodium chloride 0.9 % bolus 1,000 mL (0 mLs Intravenous Stopped 08/31/22 1349)  dexamethasone (DECADRON) tablet 10 mg (10 mg Oral Given 08/31/22 1204)  albuterol (PROVENTIL) (2.5 MG/3ML) 0.083% nebulizer solution 2.5 mg (2.5 mg Nebulization Given 08/31/22 1249)    ED Course/ Medical Decision Making/ A&P                             Medical Decision Making Amount and/or Complexity of Data Reviewed Labs: ordered. Radiology: ordered.  Risk Prescription drug management.   62 yo F with a chief complaints of fatigue and difficulty breathing and nausea and vomiting.  This been going on for little bit over a week and a half.  No known sick contacts.  No fevers.  She has signs of an upper respiratory illness.  Will try 1 DuoNeb here and with history of recurrent bronchitis.  Chest x-ray blood work.  Reassess.  Patient is feeling bit better after her DuoNeb.  Chest x-ray independently interpreted by me without focal infiltrate.  No acute anemia.  Her renal function is higher than last check the last check in our system is from 2009.  She is not sure if she has any renal dysfunction at baseline.  She is feeling better would like to go home.  Will have her follow-up with her family doctor in the office.  2:14 PM:  I have discussed the diagnosis/risks/treatment options  with the patient.  Evaluation and diagnostic testing in the emergency department does not suggest an emergent condition requiring admission or immediate intervention beyond what has been performed at this time.  They will follow up with PCP. We also discussed returning to the ED immediately if new or worsening sx occur. We discussed the sx which are most concerning (e.g., sudden worsening pain, fever, inability to tolerate by mouth) that necessitate immediate return. Medications administered to the patient during their visit and any new prescriptions provided to the  patient are listed below.  Medications given during this visit Medications  ipratropium-albuterol (DUONEB) 0.5-2.5 (3) MG/3ML nebulizer solution 3 mL (3 mLs Nebulization Given 08/31/22 1127)  sodium chloride 0.9 % bolus 1,000 mL (0 mLs Intravenous Stopped 08/31/22 1349)  dexamethasone (DECADRON) tablet 10 mg (10 mg Oral Given 08/31/22 1204)  albuterol (PROVENTIL) (2.5 MG/3ML) 0.083% nebulizer solution 2.5 mg (2.5 mg Nebulization Given 08/31/22 1249)     The patient appears reasonably screen and/or stabilized for discharge and I doubt any other medical condition or other [facility] requiring further screening, evaluation, or treatment in the ED at this time prior to discharge.          Final Clinical Impression(s) / ED Diagnoses Final diagnoses:  Viral syndrome  Bronchitis    Rx / DC Orders ED Discharge Orders          Ordered    azithromycin (ZITHROMAX) 250 MG tablet  Daily        08/31/22 1413    ondansetron (ZOFRAN-ODT) 4 MG disintegrating tablet        08/31/22 1413              Melene Plan, DO 08/31/22 1414

## 2022-08-31 NOTE — ED Notes (Signed)
Patient requesting another breathing treatment and stated she felt better with the treatment

## 2022-08-31 NOTE — ED Triage Notes (Signed)
She c/o "not feeling right" for ~ 3 weeks. Her sx are persistent feeling of shortness of breath and generalized weakness. She has seen her pcp recently for these s/s and she has had cxr which "ruled out pneumonia and they ruled out COVID" she has fine audible exp. Wheezes and a mild stridor which improves as she becomes calm. She denies fever/n/v/d, but does c/o anorexia.

## 2022-09-27 ENCOUNTER — Ambulatory Visit: Payer: 59 | Admitting: Adult Health

## 2022-09-27 ENCOUNTER — Encounter: Payer: Self-pay | Admitting: Adult Health

## 2022-09-27 VITALS — BP 118/88 | HR 87 | Temp 97.5°F | Resp 18 | Ht 70.0 in | Wt 201.8 lb

## 2022-09-27 DIAGNOSIS — F17200 Nicotine dependence, unspecified, uncomplicated: Secondary | ICD-10-CM

## 2022-09-27 DIAGNOSIS — Z7689 Persons encountering health services in other specified circumstances: Secondary | ICD-10-CM | POA: Diagnosis not present

## 2022-09-27 DIAGNOSIS — E1122 Type 2 diabetes mellitus with diabetic chronic kidney disease: Secondary | ICD-10-CM

## 2022-09-27 DIAGNOSIS — N1832 Chronic kidney disease, stage 3b: Secondary | ICD-10-CM

## 2022-09-27 DIAGNOSIS — Z122 Encounter for screening for malignant neoplasm of respiratory organs: Secondary | ICD-10-CM

## 2022-09-27 DIAGNOSIS — Z113 Encounter for screening for infections with a predominantly sexual mode of transmission: Secondary | ICD-10-CM

## 2022-09-27 DIAGNOSIS — Z1211 Encounter for screening for malignant neoplasm of colon: Secondary | ICD-10-CM

## 2022-09-27 DIAGNOSIS — I10 Essential (primary) hypertension: Secondary | ICD-10-CM

## 2022-09-27 DIAGNOSIS — J432 Centrilobular emphysema: Secondary | ICD-10-CM

## 2022-09-27 DIAGNOSIS — E785 Hyperlipidemia, unspecified: Secondary | ICD-10-CM

## 2022-09-27 DIAGNOSIS — K219 Gastro-esophageal reflux disease without esophagitis: Secondary | ICD-10-CM

## 2022-09-27 MED ORDER — ALBUTEROL SULFATE HFA 108 (90 BASE) MCG/ACT IN AERS
2.0000 | INHALATION_SPRAY | Freq: Four times a day (QID) | RESPIRATORY_TRACT | 3 refills | Status: DC | PRN
Start: 2022-09-27 — End: 2023-10-05

## 2022-09-27 MED ORDER — BREZTRI AEROSPHERE 160-9-4.8 MCG/ACT IN AERO
2.0000 | INHALATION_SPRAY | Freq: Two times a day (BID) | RESPIRATORY_TRACT | 3 refills | Status: DC
Start: 2022-09-27 — End: 2023-07-05

## 2022-09-27 NOTE — Patient Instructions (Signed)
Preventive Care 40-64 Years Old, Female Preventive care refers to lifestyle choices and visits with your health care provider that can promote health and wellness. Preventive care visits are also called wellness exams. What can I expect for my preventive care visit? Counseling Your health care provider may ask you questions about your: Medical history, including: Past medical problems. Family medical history. Pregnancy history. Current health, including: Menstrual cycle. Method of birth control. Emotional well-being. Home life and relationship well-being. Sexual activity and sexual health. Lifestyle, including: Alcohol, nicotine or tobacco, and drug use. Access to firearms. Diet, exercise, and sleep habits. Work and work environment. Sunscreen use. Safety issues such as seatbelt and bike helmet use. Physical exam Your health care provider will check your: Height and weight. These may be used to calculate your BMI (body mass index). BMI is a measurement that tells if you are at a healthy weight. Waist circumference. This measures the distance around your waistline. This measurement also tells if you are at a healthy weight and may help predict your risk of certain diseases, such as type 2 diabetes and high blood pressure. Heart rate and blood pressure. Body temperature. Skin for abnormal spots. What immunizations do I need?  Vaccines are usually given at various ages, according to a schedule. Your health care provider will recommend vaccines for you based on your age, medical history, and lifestyle or other factors, such as travel or where you work. What tests do I need? Screening Your health care provider may recommend screening tests for certain conditions. This may include: Lipid and cholesterol levels. Diabetes screening. This is done by checking your blood sugar (glucose) after you have not eaten for a while (fasting). Pelvic exam and Pap test. Hepatitis B test. Hepatitis C  test. HIV (human immunodeficiency virus) test. STI (sexually transmitted infection) testing, if you are at risk. Lung cancer screening. Colorectal cancer screening. Mammogram. Talk with your health care provider about when you should start having regular mammograms. This may depend on whether you have a family history of breast cancer. BRCA-related cancer screening. This may be done if you have a family history of breast, ovarian, tubal, or peritoneal cancers. Bone density scan. This is done to screen for osteoporosis. Talk with your health care provider about your test results, treatment options, and if necessary, the need for more tests. Follow these instructions at home: Eating and drinking  Eat a diet that includes fresh fruits and vegetables, whole grains, lean protein, and low-fat dairy products. Take vitamin and mineral supplements as recommended by your health care provider. Do not drink alcohol if: Your health care provider tells you not to drink. You are pregnant, may be pregnant, or are planning to become pregnant. If you drink alcohol: Limit how much you have to 0-1 drink a day. Know how much alcohol is in your drink. In the [place] one drink equals one 12 oz bottle of beer (355 mL), one 5 oz glass of wine (148 mL), or one 1 oz glass of hard liquor (44 mL). Lifestyle Brush your teeth every morning and night with fluoride toothpaste. Floss one time each day. Exercise for at least 30 minutes 5 or more days each week. Do not use any products that contain nicotine or tobacco. These products include cigarettes, chewing tobacco, and vaping devices, such as e-cigarettes. If you need help quitting, ask your health care provider. Do not use drugs. If you are sexually active, practice safe sex. Use a condom or other form of protection to   prevent STIs. If you do not wish to become pregnant, use a form of birth control. If you plan to become pregnant, see your health care provider for a  prepregnancy visit. Take aspirin only as told by your health care provider. Make sure that you understand how much to take and what form to take. Work with your health care provider to find out whether it is safe and beneficial for you to take aspirin daily. Find healthy ways to manage stress, such as: Meditation, yoga, or listening to music. Journaling. Talking to a trusted person. Spending time with friends and family. Minimize exposure to UV radiation to reduce your risk of skin cancer. Safety Always wear your seat belt while driving or riding in a vehicle. Do not drive: If you have been drinking alcohol. Do not ride with someone who has been drinking. When you are tired or distracted. While texting. If you have been using any mind-altering substances or drugs. Wear a helmet and other protective equipment during sports activities. If you have firearms in your house, make sure you follow all gun safety procedures. Seek help if you have been physically or sexually abused. What's next? Visit your health care provider once a year for an annual wellness visit. Ask your health care provider how often you should have your eyes and teeth checked. Stay up to date on all vaccines. This information is not intended to replace advice given to you by your health care provider. Make sure you discuss any questions you have with your health care provider. Document Revised: 10/08/2020 Document Reviewed: 10/08/2020 Elsevier Patient Education  2024 [employer]  

## 2022-09-27 NOTE — Progress Notes (Signed)
[facility] clinic  Provider:  Kenard Gower DNP  Code Status: Full Code  Goals of Care:     09/27/2022    9:57 AM  Advanced Directives  Does Patient Have a Medical Advance Directive? No  Would patient like information on creating a medical advance directive? No - Patient declined     Chief Complaint  Patient presents with   Establish Care    New Patient to establish care.    HPI: Patient is a 62 y.o. female seen today to establish care with [facility]. She was previously followed up at [facility]. She is single, never married, and lives by herself. She works for [employer] with [employer]. She completed 2.5 years of college then went to technical school. She has an 20 year old cat. She was seen by Dr. Mercy Riding, cardiologist at [facility]. She was referred to cardiology because she was thought to have had a heart attack. Cardiologist, Dr. Mercy Riding, does not think she had a heart attack after doing EKG. She declines colonoscopy. She had her 2nd colonoscopy done in 2005. After 2 days, she woke up in pain and was told she had colon perforation. She had peritonitis and stayed in the hospital for 11.5 days. Her last pap smear was done in 2017. She denied being sexually active. She had a twin sister who died from breast cancer on 2013/05/02. She had a brother who died from cirrhosis of the liver, drank alcohol. She has an older brother who is living who has health issues as well. She smokes a pack/day. CT scan was last done on 12/20/2016 which showed mild centrilobular emphysema. She currently takes Breztri for emphysema. She is not interested in stopping smoking and declines Nicotine patch. She is scheduling to follow up with a pulmonologist, Dr. Eulis Foster, at [facility] center. She reported having shortness of breath daily and uses She follows up with Dr. Luan Moore at [facility].     Past Medical History:  Diagnosis Date   Bronchitis    High blood sugar    High  cholesterol    Shortness of breath     Past Surgical History:  Procedure Laterality Date   COLONOSCOPY  2005   LYMPHADENECTOMY Right 1969   SHOULDER SURGERY     THYROIDECTOMY  2016    Allergies  Allergen Reactions   Iohexol      Code: HIVES, Desc: SINGULAR HIVE LT CHEST S/P 13 HR PREP OF PREDNISONE & BENADRYL//A.C.     Outpatient Encounter Medications as of 09/27/2022  Medication Sig   albuterol (VENTOLIN HFA) 108 (90 Base) MCG/ACT inhaler Inhale 2 puffs into the lungs 4 (four) times daily as needed for wheezing or shortness of breath.   atorvastatin (LIPITOR) 20 MG tablet Take 20 mg by mouth every evening.   Budeson-Glycopyrrol-Formoterol (BREZTRI AEROSPHERE) 160-9-4.8 MCG/ACT AERO Inhale 2 puffs into the lungs 4 (four) times daily as needed.   cyclobenzaprine (FLEXERIL) 10 MG tablet Take 10 mg by mouth 3 (three) times daily as needed for muscle spasms.   meloxicam (MOBIC) 15 MG tablet Take 15 mg by mouth as needed for pain.   metFORMIN (GLUCOPHAGE) 500 MG tablet Take 500 mg by mouth 2 (two) times daily.   omeprazole (PRILOSEC) 40 MG capsule Take 40 mg by mouth daily.   ondansetron (ZOFRAN-ODT) 4 MG disintegrating tablet Dissolve 1 tablet under the tongue every 4 hours as needed for nausea and vomiting   promethazine (PHENERGAN) 25 MG tablet Take 25 mg by mouth every  6 (six) hours as needed.   traMADol (ULTRAM) 50 MG tablet Take 50 mg by mouth as needed for severe pain.   [DISCONTINUED] budesonide-formoterol (SYMBICORT) 80-4.5 MCG/ACT inhaler Inhale 2 puffs into the lungs 2 (two) times daily.   lisinopril-hydrochlorothiazide (ZESTORETIC) 10-12.5 MG tablet Take 1 tablet by mouth daily.   [DISCONTINUED] azithromycin (ZITHROMAX) 250 MG tablet Take 1 tablet (250 mg total) by mouth daily. Take first 2 tablets together, then 1 every day until finished.   No facility-administered encounter medications on file as of 09/27/2022.    Review of Systems:  Review of Systems  Constitutional:   Negative for appetite change, chills, fatigue and fever.  HENT:  Negative for congestion, hearing loss, rhinorrhea and sore throat.   Eyes: Negative.   Respiratory:  Positive for cough, shortness of breath and wheezing.   Cardiovascular:  Negative for chest pain, palpitations and leg swelling.  Gastrointestinal:  Negative for abdominal pain, constipation, diarrhea, nausea and vomiting.  Genitourinary:  Negative for dysuria.  Musculoskeletal:  Negative for arthralgias, back pain and myalgias.  Skin:  Negative for color change, rash and wound.  Neurological:  Negative for dizziness, weakness and headaches.  Psychiatric/Behavioral:  Negative for behavioral problems. The patient is not nervous/anxious.     Health Maintenance  Topic Date Due   HIV Screening  Never done   Diabetic kidney evaluation - Urine ACR  Never done   Hepatitis C Screening  Never done   PAP SMEAR-Modifier  Never done   Colonoscopy  Never done   Zoster Vaccines- Shingrix (1 of 2) Never done   DTaP/Tdap/Td (2 - Tdap) 03/26/2014   COVID-19 Vaccine (3 - 2023-24 season) 12/25/2021   INFLUENZA VACCINE  11/25/2022   Diabetic kidney evaluation - eGFR measurement  08/31/2023   MAMMOGRAM  03/31/2024   HPV VACCINES  Aged Out    Physical Exam: Vitals:   09/27/22 1421  BP: 118/88  Pulse: 87  Resp: 18  Temp: (!) 97.5 F (36.4 C)  SpO2: 95%  Weight: 201 lb 12.8 oz (91.5 kg)  Height: 5\' 10"  (1.778 m)   Body mass index is 28.96 kg/m. Physical Exam Constitutional:      Appearance: Normal appearance.  HENT:     Head: Normocephalic and atraumatic.     Nose: Nose normal.     Mouth/Throat:     Mouth: Mucous membranes are moist.  Eyes:     Conjunctiva/sclera: Conjunctivae normal.  Cardiovascular:     Rate and Rhythm: Normal rate and regular rhythm.  Pulmonary:     Effort: Pulmonary effort is normal.     Breath sounds: Normal breath sounds.  Abdominal:     General: Bowel sounds are normal.     Palpations: Abdomen  is soft.  Musculoskeletal:        General: Normal range of motion.     Cervical back: Normal range of motion.  Skin:    General: Skin is warm and dry.  Neurological:     General: No focal deficit present.     Mental Status: She is alert and oriented to person, place, and time.  Psychiatric:        Mood and Affect: Mood normal.        Behavior: Behavior normal.        Thought Content: Thought content normal.        Judgment: Judgment normal.     Labs reviewed: Basic Metabolic Panel: Recent Labs    08/31/22 1206  NA 137  K 4.3  CL 100  CO2 27  GLUCOSE 111*  BUN 30*  CREATININE 1.82*  CALCIUM 9.6  MG 2.0   Liver Function Tests: Recent Labs    08/31/22 1206  AST 11*  ALT 12  ALKPHOS 71  BILITOT 0.4  PROT 7.7  ALBUMIN 4.1   No results for input(s): "LIPASE", "AMYLASE" in the last 8760 hours. No results for input(s): "AMMONIA" in the last 8760 hours. CBC: Recent Labs    08/31/22 1206  WBC 7.5  NEUTROABS 5.4  HGB 11.9*  HCT 35.9*  MCV 90.7  PLT 228   Lipid Panel: No results for input(s): "CHOL", "HDL", "LDLCALC", "TRIG", "CHOLHDL", "LDLDIRECT" in the last 8760 hours. No results found for: "HGBA1C"  Procedures since last visit: DG Chest Port 1 View  Result Date: 08/31/2022 CLINICAL DATA:  Fatigue and shortness of breath EXAM: PORTABLE CHEST 1 VIEW COMPARISON:  CT chest dated 12/20/2016, chest radiograph dated 06/24/2006 FINDINGS: Normal lung volumes. No focal consolidations. No pleural effusion or pneumothorax. The heart size and mediastinal contours are within normal limits. No acute osseous abnormality. Surgical clips project over the right neck. IMPRESSION: No active disease. Electronically Signed   By: Agustin Cree M.D.   On: 08/31/2022 12:01    Assessment/Plan  1. Encounter to establish care -  established care with [facility]  2. Primary hypertension -  stopped taking Lisinopril-HCTZ since Aug 27, 2022 - Basic Metabolic Panel with eGFR; Future  3. Type 2  diabetes mellitus with stage 3b chronic kidney disease, without long-term current use of insulin (HCC) -  continue taking Metformin - Hemoglobin A1C; Future  4. Hyperlipidemia, unspecified hyperlipidemia type -  continue Atorvastatin - Lipid panel; Future  5. Centrilobular emphysema (HCC) -  will make appointment with pulmonology at [facility] - Budeson-Glycopyrrol-Formoterol (BREZTRI AEROSPHERE) 160-9-4.8 MCG/ACT AERO; Inhale 2 puffs into the lungs 2 (two) times daily.  Dispense: 10.7 g; Refill: 3 - albuterol (VENTOLIN HFA) 108 (90 Base) MCG/ACT inhaler; Inhale 2 puffs into the lungs 4 (four) times daily as needed for wheezing or shortness of breath.  Dispense: 8 g; Refill: 3  6. Gastroesophageal reflux disease without esophagitis -  continue Omeprazole  7. TOBACCO ABUSE -  declines Nicotine patch -  counseled  8. Screen for STD (sexually transmitted disease) - Hep C Antibody; Future - HIV antibody (with reflex); Future  9. Encounter for screening for lung cancer -  CT chest lung low dose  10. Screening for colon cancer -  declines colonoscopy - Cologuard    Labs/tests ordered:   BMP. Lipid panel A1C, HIV and hep C antibody  Next appt:  Visit date not found

## 2022-10-20 ENCOUNTER — Other Ambulatory Visit: Payer: 59

## 2022-10-20 DIAGNOSIS — E1122 Type 2 diabetes mellitus with diabetic chronic kidney disease: Secondary | ICD-10-CM

## 2022-10-20 DIAGNOSIS — E785 Hyperlipidemia, unspecified: Secondary | ICD-10-CM

## 2022-10-20 DIAGNOSIS — Z113 Encounter for screening for infections with a predominantly sexual mode of transmission: Secondary | ICD-10-CM

## 2022-10-20 DIAGNOSIS — I10 Essential (primary) hypertension: Secondary | ICD-10-CM

## 2022-10-21 LAB — HEMOGLOBIN A1C
Hgb A1c MFr Bld: 6.6 % of total Hgb — ABNORMAL HIGH (ref <5.7)
Mean Plasma Glucose: 143 mg/dL
eAG (mmol/L): 7.9 mmol/L

## 2022-10-21 LAB — LIPID PANEL
Cholesterol: 167 mg/dL (ref <200)
HDL: 64 mg/dL (ref >=50)
LDL Cholesterol (Calc): 81 mg/dL (calc)
Non-HDL Cholesterol (Calc): 103 mg/dL (calc) (ref <130)
Total CHOL/HDL Ratio: 2.6 (calc) (ref <5.0)
Triglycerides: 128 mg/dL (ref <150)

## 2022-10-21 LAB — BASIC METABOLIC PANEL WITH GFR
BUN/Creatinine Ratio: 15 (calc) (ref 6–22)
BUN: 24 mg/dL (ref 7–25)
CO2: 28 mmol/L (ref 20–32)
Calcium: 9.8 mg/dL (ref 8.6–10.4)
Chloride: 103 mmol/L (ref 98–110)
Creat: 1.56 mg/dL — ABNORMAL HIGH (ref 0.50–1.05)
Glucose, Bld: 119 mg/dL — ABNORMAL HIGH (ref 65–99)
Potassium: 4.9 mmol/L (ref 3.5–5.3)
Sodium: 141 mmol/L (ref 135–146)
eGFR: 37 mL/min/{1.73_m2} — ABNORMAL LOW (ref >=60)

## 2022-10-21 LAB — HEPATITIS C ANTIBODY: Hepatitis C Ab: NONREACTIVE

## 2022-10-21 LAB — HIV ANTIBODY (ROUTINE TESTING W REFLEX): HIV 1&2 Ab, 4th Generation: NONREACTIVE

## 2022-10-27 ENCOUNTER — Telehealth: Payer: Self-pay

## 2022-10-27 NOTE — Telephone Encounter (Signed)
Patient called back regarding lab results. Patient verbalized her understanding.

## 2022-11-10 ENCOUNTER — Ambulatory Visit
Admission: RE | Admit: 2022-11-10 | Discharge: 2022-11-10 | Disposition: A | Discharge status: Home / Self Care | Payer: 59 | Source: Ambulatory Visit | Attending: Adult Health | Admitting: Adult Health

## 2022-11-10 DIAGNOSIS — Z122 Encounter for screening for malignant neoplasm of respiratory organs: Secondary | ICD-10-CM

## 2022-11-16 NOTE — Progress Notes (Signed)
CT lung will need to be repeated in a year per recommendation.

## 2022-11-24 ENCOUNTER — Encounter: Payer: Self-pay | Admitting: Internal Medicine

## 2022-11-24 ENCOUNTER — Telehealth: Payer: Self-pay

## 2022-11-24 ENCOUNTER — Ambulatory Visit: Payer: 59 | Admitting: Internal Medicine

## 2022-11-24 VITALS — BP 128/72 | HR 103 | Temp 98.2°F | Resp 18 | Ht 68.0 in | Wt 195.2 lb

## 2022-11-24 DIAGNOSIS — B349 Viral infection, unspecified: Secondary | ICD-10-CM | POA: Diagnosis not present

## 2022-11-24 DIAGNOSIS — R112 Nausea with vomiting, unspecified: Secondary | ICD-10-CM | POA: Diagnosis not present

## 2022-11-24 DIAGNOSIS — K2900 Acute gastritis without bleeding: Secondary | ICD-10-CM

## 2022-11-24 DIAGNOSIS — R630 Anorexia: Secondary | ICD-10-CM

## 2022-11-24 DIAGNOSIS — R059 Cough, unspecified: Secondary | ICD-10-CM

## 2022-11-24 DIAGNOSIS — J411 Mucopurulent chronic bronchitis: Secondary | ICD-10-CM

## 2022-11-24 LAB — POC COVID19 BINAXNOW: SARS Coronavirus 2 Ag: NEGATIVE

## 2022-11-24 MED ORDER — PREDNISONE 20 MG PO TABS: 20.0000 mg | ORAL_TABLET | Freq: Every day | ORAL | 0 refills | Status: DC

## 2022-11-24 MED ORDER — ONDANSETRON 4 MG PO TBDP: ORAL_TABLET | ORAL | 0 refills | Status: DC

## 2022-11-24 MED ORDER — OMEPRAZOLE 40 MG PO CPDR: 40.0000 mg | DELAYED_RELEASE_CAPSULE | Freq: Every day | ORAL | 3 refills | Status: DC

## 2022-11-24 NOTE — Progress Notes (Signed)
Location:     Place of Service:     Provider:   Code Status:  Goals of Care:     11/24/2022    2:04 PM  Advanced Directives  Does Patient Have a Medical Advance Directive? No  Would patient like information on creating a medical advance directive? No - Patient declined     Chief Complaint  Patient presents with   Acute Visit    Patient states she has been experiencing some nausea, loss of appetite, sore throat and headache.    HPI: Patient is a 62 y.o. female seen today for an acute visit for Cough with Scratchy throat few days ago  Then she also developed Nausea and Vomiting No abdominal pain no diarrhea No fever no Chest pain No SOB Feels Tired Fatigue Not eating Drinking water Coughing Clear mucus  Has h/o Smoking Continues to smoke  Recent CT scan screening was negative but did show COPD  Has h/o Diabetes and HLD  Past Medical History:  Diagnosis Date   Bronchitis    High blood sugar    High cholesterol    Shortness of breath     Past Surgical History:  Procedure Laterality Date   COLONOSCOPY  2005   LYMPHADENECTOMY Right 1969   SHOULDER SURGERY     THYROIDECTOMY  2016    Allergies  Allergen Reactions   Iohexol      Code: HIVES, Desc: SINGULAR HIVE LT CHEST S/P 13 HR PREP OF PREDNISONE & BENADRYL//A.C.     Outpatient Encounter Medications as of 11/24/2022  Medication Sig   albuterol (VENTOLIN HFA) 108 (90 Base) MCG/ACT inhaler Inhale 2 puffs into the lungs 4 (four) times daily as needed for wheezing or shortness of breath.   atorvastatin (LIPITOR) 20 MG tablet Take 20 mg by mouth every evening.   Budeson-Glycopyrrol-Formoterol (BREZTRI AEROSPHERE) 160-9-4.8 MCG/ACT AERO Inhale 2 puffs into the lungs 2 (two) times daily.   meloxicam (MOBIC) 15 MG tablet Take 15 mg by mouth as needed for pain.   metFORMIN (GLUCOPHAGE) 500 MG tablet Take 500 mg by mouth 2 (two) times daily.   predniSONE (DELTASONE) 20 MG tablet Take 1 tablet (20 mg total) by mouth  daily with breakfast.   [DISCONTINUED] omeprazole (PRILOSEC) 40 MG capsule Take 40 mg by mouth daily.   [DISCONTINUED] ondansetron (ZOFRAN-ODT) 4 MG disintegrating tablet Dissolve 1 tablet under the tongue every 4 hours as needed for nausea and vomiting   omeprazole (PRILOSEC) 40 MG capsule Take 1 capsule (40 mg total) by mouth daily.   ondansetron (ZOFRAN-ODT) 4 MG disintegrating tablet Dissolve 1 tablet under the tongue every 4 hours as needed for nausea and vomiting   No facility-administered encounter medications on file as of 11/24/2022.    Review of Systems:  Review of Systems  Constitutional:  Positive for activity change, appetite change and fatigue. Negative for fever.  HENT:  Positive for congestion and sore throat.   Respiratory:  Positive for cough and wheezing. Negative for shortness of breath.   Cardiovascular:  Negative for leg swelling.  Gastrointestinal:  Negative for abdominal distention, abdominal pain, constipation and diarrhea.  Genitourinary: Negative.   Musculoskeletal:  Positive for myalgias. Negative for arthralgias and gait problem.  Skin: Negative.   Neurological:  Positive for weakness. Negative for dizziness.  Psychiatric/Behavioral:  Negative for confusion, dysphoric mood and sleep disturbance.     Health Maintenance  Topic Date Due   Diabetic kidney evaluation - Urine ACR  Never done  PAP SMEAR-Modifier  Never done   Colonoscopy  Never done   Zoster Vaccines- Shingrix (1 of 2) Never done   DTaP/Tdap/Td (2 - Tdap) 03/26/2014   COVID-19 Vaccine (3 - 2023-24 season) 12/25/2021   INFLUENZA VACCINE  11/25/2022   Diabetic kidney evaluation - eGFR measurement  10/20/2023   MAMMOGRAM  03/31/2024   Hepatitis C Screening  Completed   HIV Screening  Completed   HPV VACCINES  Aged Out    Physical Exam: Vitals:   11/24/22 1400  BP: 128/72  Pulse: (!) 103  Resp: 18  Temp: 98.2 F (36.8 C)  TempSrc: Temporal  SpO2: 92%  Weight: 195 lb 3.2 oz (88.5 kg)   Height: 5\' 8"  (1.727 m)   Body mass index is 29.68 kg/m. Physical Exam Vitals reviewed.  Constitutional:      Appearance: Normal appearance.  HENT:     Head: Normocephalic.     Nose: Nose normal.     Mouth/Throat:     Mouth: Mucous membranes are moist.     Pharynx: Oropharynx is clear.  Eyes:     Pupils: Pupils are equal, round, and reactive to light.  Cardiovascular:     Rate and Rhythm: Normal rate and regular rhythm.     Pulses: Normal pulses.     Heart sounds: Normal heart sounds. No murmur heard. Pulmonary:     Effort: Pulmonary effort is normal.     Breath sounds: Rhonchi present.  Abdominal:     General: Abdomen is flat. Bowel sounds are normal.     Palpations: Abdomen is soft.  Musculoskeletal:        General: No swelling.     Cervical back: Neck supple.  Skin:    General: Skin is warm.  Neurological:     General: No focal deficit present.     Mental Status: She is alert and oriented to person, place, and time.  Psychiatric:        Mood and Affect: Mood normal.        Thought Content: Thought content normal.     Labs reviewed: Basic Metabolic Panel: Recent Labs    08/31/22 1206 10/20/22 0849  NA 137 141  K 4.3 4.9  CL 100 103  CO2 27 28  GLUCOSE 111* 119*  BUN 30* 24  CREATININE 1.82* 1.56*  CALCIUM 9.6 9.8  MG 2.0  --    Liver Function Tests: Recent Labs    08/31/22 1206  AST 11*  ALT 12  ALKPHOS 71  BILITOT 0.4  PROT 7.7  ALBUMIN 4.1   No results for input(s): "LIPASE", "AMYLASE" in the last 8760 hours. No results for input(s): "AMMONIA" in the last 8760 hours. CBC: Recent Labs    08/31/22 1206  WBC 7.5  NEUTROABS 5.4  HGB 11.9*  HCT 35.9*  MCV 90.7  PLT 228   Lipid Panel: Recent Labs    10/20/22 0849  CHOL 167  HDL 64  LDLCALC 81  TRIG 128  CHOLHDL 2.6   Lab Results  Component Value Date   HGBA1C 6.6 (H) 10/20/2022    Procedures since last visit: CT CHEST LUNG CA SCREEN LOW DOSE W/O CM  Result Date:  11/16/2022 CLINICAL DATA:  62 year old female current smoker with 39 pack-year history of smoking. Lung cancer screening examination. EXAM: CT CHEST WITHOUT CONTRAST LOW-DOSE FOR LUNG CANCER SCREENING TECHNIQUE: Multidetector CT imaging of the chest was performed following the standard protocol without IV contrast. RADIATION DOSE REDUCTION: This exam was performed  according to the departmental dose-optimization program which includes automated exposure control, adjustment of the mA and/or kV according to patient size and/or use of iterative reconstruction technique. COMPARISON:  Low-dose lung cancer screening chest CT 12/20/2016. FINDINGS: Cardiovascular: Heart size is normal. There is no significant pericardial fluid, thickening or pericardial calcification. There is aortic atherosclerosis, as well as atherosclerosis of the great vessels of the mediastinum and the coronary arteries, including calcified atherosclerotic plaque in the left anterior descending coronary artery. Mediastinum/Nodes: No pathologically enlarged mediastinal or hilar lymph nodes. Please note that accurate exclusion of hilar adenopathy is limited on noncontrast CT scans. Small hiatal hernia. No axillary lymphadenopathy. Lungs/Pleura: Several small pulmonary nodules are again noted, largest of which is in the anterior aspect of the right lower lobe abutting the major fissure (axial image 144) with a volume derived mean diameter of 3.5 mm. No other larger more suspicious appearing pulmonary nodules or masses are noted. No acute consolidative airspace disease. No pleural effusions. Mild diffuse bronchial wall thickening with very mild centrilobular and paraseptal emphysema. Upper Abdomen: Nonobstructive calculi in the upper pole collecting system of the left kidney measuring 1-2 mm in size. Musculoskeletal: There are no aggressive appearing lytic or blastic lesions noted in the visualized portions of the skeleton. IMPRESSION: 1. Lung-RADS 2S,  benign appearance or behavior. Continue annual screening with low-dose chest CT without contrast in 12 months. 2. The "S" modifier above refers to potentially clinically significant non lung cancer related findings. Specifically, there is aortic atherosclerosis, in addition to left anterior descending coronary artery disease. Please note that although the presence of coronary artery calcium documents the presence of coronary artery disease, the severity of this disease and any potential stenosis cannot be assessed on this non-gated CT examination. Assessment for potential risk factor modification, dietary therapy or pharmacologic therapy may be warranted, if clinically indicated. 3. Mild diffuse bronchial wall thickening with mild centrilobular and paraseptal emphysema; imaging findings suggestive of underlying COPD. 4. Nephrolithiasis. Aortic Atherosclerosis (ICD10-I70.0) and Emphysema (ICD10-J43.9). Electronically Signed   By: Trudie Reed M.D.   On: 11/16/2022 12:14    Assessment/Plan 1. Cough, unspecified type/ Covid negative Bronchitis Prednisone 20 mg every day for 10 days OTC Mucinex PRN - POC COVID-19  2. Loss of appetite  - POC COVID-19 Covid Negative  3. Viral illness Tylenol PRN  4. Nausea and vomiting, unspecified vomiting type Change Prilosec BID for 14 days and then back to QD Zofran PRN Encourage PO fluids Increase as tolerated  Call if not better by next week   Labs/tests ordered:  * No order type specified * Next appt:  Visit date not found

## 2022-11-24 NOTE — Patient Instructions (Signed)
Take Prilosec 40 mg BID for 2 week sand then go back to every day Can use OTC Mucinex prn

## 2022-11-24 NOTE — Telephone Encounter (Signed)
Patient was at work and asked to leave by her employer due to URI symptoms, nauseated, and weakness. Patient had a negative at home covid test on Monday, however she still has sore throat, congestion, weakness and feeling nauseated. Patient asked to be seen sooner than later, although we did not have any appointments this morning, we were able to offer a 2:20 pm appointment with Dr.Gupta.

## 2022-12-09 ENCOUNTER — Ambulatory Visit: Payer: 59 | Admitting: Adult Health

## 2022-12-16 ENCOUNTER — Ambulatory Visit: Payer: 59 | Admitting: Adult Health

## 2022-12-16 ENCOUNTER — Encounter: Payer: Self-pay | Admitting: Adult Health

## 2022-12-16 VITALS — BP 138/82 | HR 80 | Temp 97.2°F | Ht 68.0 in | Wt 201.0 lb

## 2022-12-16 DIAGNOSIS — K219 Gastro-esophageal reflux disease without esophagitis: Secondary | ICD-10-CM | POA: Diagnosis not present

## 2022-12-16 DIAGNOSIS — Z0000 Encounter for general adult medical examination without abnormal findings: Secondary | ICD-10-CM

## 2022-12-16 DIAGNOSIS — I10 Essential (primary) hypertension: Secondary | ICD-10-CM

## 2022-12-16 DIAGNOSIS — E1122 Type 2 diabetes mellitus with diabetic chronic kidney disease: Secondary | ICD-10-CM

## 2022-12-16 DIAGNOSIS — J432 Centrilobular emphysema: Secondary | ICD-10-CM

## 2022-12-16 DIAGNOSIS — N1832 Chronic kidney disease, stage 3b: Secondary | ICD-10-CM

## 2022-12-16 NOTE — Progress Notes (Signed)
[facility] clinic  Provider:  Kenard Gower DNP  Code Status:  Full Code  Goals of Care:     12/16/2022    3:23 PM  Advanced Directives  Does Patient Have a Medical Advance Directive? No  Would patient like information on creating a medical advance directive? No - Patient declined     Chief Complaint  Patient presents with   medication Managemet Chronic issues     2-3 week follow up Cough. Discuss Diabetic kidney evaluation , PaP, Colonoscopy, Shingrix, Tdap, covid vaccine, Flu vaccine.    HPI: Patient is a 62 y.o. female seen today for medication management of chronic issues.   Type 2 diabetes mellitus with stage 3b chronic kidney disease, without long-term current use of insulin (HCC) -  does not check blood sugar at home. "I can't do it." Takes Metformin  Gastroesophageal reflux disease without esophagitis -  throws up in the morning, takes Prilosec at night  Centrilobular emphysema (HCC) -  recently completed 10 days of Prednisone, takes Breztri, has occasional SOB    Past Medical History:  Diagnosis Date   Bronchitis    High blood sugar    High cholesterol    Shortness of breath     Past Surgical History:  Procedure Laterality Date   COLONOSCOPY  2005   LYMPHADENECTOMY Right 1969   SHOULDER SURGERY     THYROIDECTOMY  2016    Allergies  Allergen Reactions   Iohexol      Code: HIVES, Desc: SINGULAR HIVE LT CHEST S/P 13 HR PREP OF PREDNISONE & BENADRYL//A.C.     Outpatient Encounter Medications as of 12/16/2022  Medication Sig   albuterol (VENTOLIN HFA) 108 (90 Base) MCG/ACT inhaler Inhale 2 puffs into the lungs 4 (four) times daily as needed for wheezing or shortness of breath.   atorvastatin (LIPITOR) 20 MG tablet Take 20 mg by mouth every evening.   Budeson-Glycopyrrol-Formoterol (BREZTRI AEROSPHERE) 160-9-4.8 MCG/ACT AERO Inhale 2 puffs into the lungs 2 (two) times daily.   meloxicam (MOBIC) 15 MG tablet Take 15 mg by mouth as needed for pain.    metFORMIN (GLUCOPHAGE) 500 MG tablet Take 500 mg by mouth 2 (two) times daily.   omeprazole (PRILOSEC) 40 MG capsule Take 1 capsule (40 mg total) by mouth daily.   [DISCONTINUED] ondansetron (ZOFRAN-ODT) 4 MG disintegrating tablet Dissolve 1 tablet under the tongue every 4 hours as needed for nausea and vomiting   [DISCONTINUED] predniSONE (DELTASONE) 20 MG tablet Take 1 tablet (20 mg total) by mouth daily with breakfast.   No facility-administered encounter medications on file as of 12/16/2022.    Review of Systems:  Review of Systems  Constitutional:  Negative for appetite change, chills, fatigue and fever.  HENT:  Negative for congestion, hearing loss, rhinorrhea and sore throat.   Eyes: Negative.   Respiratory:  Positive for shortness of breath. Negative for cough and wheezing.   Cardiovascular:  Negative for chest pain, palpitations and leg swelling.  Gastrointestinal:  Negative for abdominal pain, constipation, diarrhea, nausea and vomiting.  Genitourinary:  Negative for dysuria.  Musculoskeletal:  Negative for arthralgias, back pain and myalgias.  Skin:  Negative for color change, rash and wound.  Neurological:  Negative for dizziness, weakness and headaches.  Psychiatric/Behavioral:  Negative for behavioral problems. The patient is not nervous/anxious.     Health Maintenance  Topic Date Due   Diabetic kidney evaluation - Urine ACR  Never done   PAP SMEAR-Modifier  Never done   Colonoscopy  Never done   Zoster Vaccines- Shingrix (1 of 2) Never done   DTaP/Tdap/Td (2 - Tdap) 03/26/2014   COVID-19 Vaccine (3 - 2023-24 season) 12/25/2021   INFLUENZA VACCINE  11/25/2022   Diabetic kidney evaluation - eGFR measurement  10/20/2023   MAMMOGRAM  03/31/2024   Hepatitis C Screening  Completed   HIV Screening  Completed   HPV VACCINES  Aged Out    Physical Exam: Vitals:   12/16/22 1525  BP: 138/82  Pulse: 80  Temp: (!) 97.2 F (36.2 C)  SpO2: 95%  Weight: 201 lb (91.2 kg)   Height: 5\' 8"  (1.727 m)   Body mass index is 30.56 kg/m. Physical Exam Constitutional:      General: She is not in acute distress.    Appearance: Normal appearance. She is obese.  HENT:     Head: Normocephalic and atraumatic.     Nose: Nose normal.     Mouth/Throat:     Mouth: Mucous membranes are moist.  Eyes:     Conjunctiva/sclera: Conjunctivae normal.  Cardiovascular:     Rate and Rhythm: Normal rate and regular rhythm.  Pulmonary:     Effort: Pulmonary effort is normal.     Breath sounds: Normal breath sounds.  Abdominal:     General: Bowel sounds are normal.     Palpations: Abdomen is soft.  Musculoskeletal:        General: Normal range of motion.     Cervical back: Normal range of motion.  Skin:    General: Skin is warm and dry.  Neurological:     General: No focal deficit present.     Mental Status: She is alert and oriented to person, place, and time.  Psychiatric:        Mood and Affect: Mood normal.        Behavior: Behavior normal.        Thought Content: Thought content normal.        Judgment: Judgment normal.     Labs reviewed: Basic Metabolic Panel: Recent Labs    08/31/22 1206 10/20/22 0849  NA 137 141  K 4.3 4.9  CL 100 103  CO2 27 28  GLUCOSE 111* 119*  BUN 30* 24  CREATININE 1.82* 1.56*  CALCIUM 9.6 9.8  MG 2.0  --    Liver Function Tests: Recent Labs    08/31/22 1206  AST 11*  ALT 12  ALKPHOS 71  BILITOT 0.4  PROT 7.7  ALBUMIN 4.1   No results for input(s): "LIPASE", "AMYLASE" in the last 8760 hours. No results for input(s): "AMMONIA" in the last 8760 hours. CBC: Recent Labs    08/31/22 1206  WBC 7.5  NEUTROABS 5.4  HGB 11.9*  HCT 35.9*  MCV 90.7  PLT 228   Lipid Panel: Recent Labs    10/20/22 0849  CHOL 167  HDL 64  LDLCALC 81  TRIG 128  CHOLHDL 2.6   Lab Results  Component Value Date   HGBA1C 6.6 (H) 10/20/2022    Procedures since last visit: No results found.  Assessment/Plan  1. Type 2  diabetes mellitus with stage 3b chronic kidney disease, without long-term current use of insulin (HCC) Lab Results  Component Value Date   HGBA1C 6.6 (H) 10/20/2022   -  continue Metformin - Microalbumin/Creatinine Ratio, Urine  2. Gastroesophageal reflux disease without esophagitis -  throws up in the morning -  instructed to take Prilosec in the morning instead of night  3. Centrilobular emphysema (  HCC) -  has occasional SOB, no wheezing -  continue Albuterol PRN and Breztri  4. Preventative health care - Ambulatory referral to Obstetrics / Gynecology   Labs/tests ordered:     Next appt:  Visit date not found

## 2022-12-17 LAB — MICROALBUMIN / CREATININE URINE RATIO
Creatinine, Urine: 194 mg/dL (ref 20–275)
Microalb Creat Ratio: 4 mg/g{creat} (ref <30)
Microalb, Ur: 0.7 mg/dL

## 2022-12-17 NOTE — Progress Notes (Signed)
-    urine microalbumin normal

## 2023-01-04 ENCOUNTER — Encounter: Payer: Self-pay | Admitting: Sports Medicine

## 2023-01-04 ENCOUNTER — Ambulatory Visit (INDEPENDENT_AMBULATORY_CARE_PROVIDER_SITE_OTHER): Payer: 59 | Admitting: Sports Medicine

## 2023-01-04 VITALS — BP 138/70 | HR 92 | Temp 97.1°F | Resp 17 | Ht 68.0 in | Wt 204.6 lb

## 2023-01-04 DIAGNOSIS — R509 Fever, unspecified: Secondary | ICD-10-CM

## 2023-01-04 DIAGNOSIS — R519 Headache, unspecified: Secondary | ICD-10-CM

## 2023-01-04 DIAGNOSIS — R0981 Nasal congestion: Secondary | ICD-10-CM | POA: Diagnosis not present

## 2023-01-04 DIAGNOSIS — J0100 Acute maxillary sinusitis, unspecified: Secondary | ICD-10-CM

## 2023-01-04 DIAGNOSIS — H9209 Otalgia, unspecified ear: Secondary | ICD-10-CM

## 2023-01-04 DIAGNOSIS — R059 Cough, unspecified: Secondary | ICD-10-CM

## 2023-01-04 DIAGNOSIS — R5383 Other fatigue: Secondary | ICD-10-CM

## 2023-01-04 DIAGNOSIS — R197 Diarrhea, unspecified: Secondary | ICD-10-CM

## 2023-01-04 LAB — POCT INFLUENZA A/B
Influenza A, POC: NEGATIVE
Influenza B, POC: NEGATIVE

## 2023-01-04 LAB — POC COVID19 BINAXNOW: SARS Coronavirus 2 Ag: NEGATIVE

## 2023-01-04 MED ORDER — AMOXICILLIN-POT CLAVULANATE 875-125 MG PO TABS: 1.0000 | ORAL_TABLET | Freq: Two times a day (BID) | ORAL | 0 refills | Status: DC

## 2023-01-04 MED ORDER — GUAIFENESIN-DM 100-10 MG/5ML PO SYRP
5.0000 mL | ORAL_SOLUTION | Freq: Four times a day (QID) | ORAL | 0 refills | Status: DC | PRN
Start: 2023-01-04 — End: 2023-05-11

## 2023-01-04 MED ORDER — ONDANSETRON HCL 4 MG PO TABS: 4.0000 mg | ORAL_TABLET | Freq: Three times a day (TID) | ORAL | 0 refills | Status: DC | PRN

## 2023-01-04 NOTE — Progress Notes (Signed)
Careteam: Patient Care Team: Gillis Santa, NP as PCP - General (Internal Medicine) Eleanora Neighbor, MD as Referring Physician (Gastroenterology) August Luz, MD as Referring Physician (Cardiology) Bethanie Dicker, MD as Referring Physician (Pulmonary Disease)  PLACE OF SERVICE:  [facility]  Advanced Directive information Does Patient Have a Medical Advance Directive?: No, Would patient like information on creating a medical advance directive?: No - Patient declined  Allergies  Allergen Reactions   Iohexol      Code: HIVES, Desc: SINGULAR HIVE LT CHEST S/P 13 HR PREP OF PREDNISONE & BENADRYL//A.C.     Chief Complaint  Patient presents with   Acute Visit    Patient complains of chills, fever, headache, congestion, scratchy throat, ear pain, nausea, diarrhea, and fatigue      HPI: Patient is a 62 y.o. female presented to clinic for acute visit with URI symptoms Felt tired on Saturday  Felt cold with chills Rt ear pain wit headache since yesterday  Nausea  URI  Runny nose-  feels congested Cough-bringing clear phlegm   Sore throat- yes , feels  scratchy Diarrhea - since 2 days , soft stools, 2 bm yesterday , denies blood in the stool Denies abdomina pain  Feels nauseous , long standing h/o  Lost appetite , not eating much  No sick contacts Slight fever 99.1 Denies myalgias Feels lightheaded  She has h/o Asthma and uses albuterol twice a day with breztri Did a home covid test which came negative Smokes 1 pack    Ear pain - rt ear  No discharge  No hearing impairment       Review of Systems:  Review of Systems  Constitutional:  Positive for malaise/fatigue. Negative for chills and fever.  HENT:  Positive for congestion, ear pain, sinus pain and sore throat. Negative for ear discharge.   Eyes:  Negative for double vision.  Respiratory:  Positive for cough and sputum production. Negative for shortness of breath.   Cardiovascular:  Negative  for chest pain, palpitations and leg swelling.  Gastrointestinal:  Positive for diarrhea and nausea. Negative for abdominal pain, blood in stool, heartburn and vomiting.  Genitourinary:  Negative for dysuria, frequency and hematuria.  Musculoskeletal:  Negative for falls and myalgias.  Neurological:  Positive for dizziness. Negative for sensory change and focal weakness.    Past Medical History:  Diagnosis Date   Bronchitis    High blood sugar    High cholesterol    Shortness of breath    Past Surgical History:  Procedure Laterality Date   COLONOSCOPY  2005   LYMPHADENECTOMY Right 1969   SHOULDER SURGERY     THYROIDECTOMY  2016   Social History:   reports that she has been smoking cigarettes. She has never used smokeless tobacco. She reports that she does not currently use alcohol. She reports that she does not use drugs.  Family History  Problem Relation Age of Onset   Colon cancer Mother    Heart disease Father    Breast cancer Sister    Liver disease Sister    Liver disease Brother    Diabetes Brother    Glaucoma Brother     Medications: Patient's Medications  New Prescriptions   AMOXICILLIN-CLAVULANATE (AUGMENTIN) 875-125 MG TABLET    Take 1 tablet by mouth 2 (two) times daily.   GUAIFENESIN-DEXTROMETHORPHAN (ROBITUSSIN DM) 100-10 MG/5ML SYRUP    Take 5 mLs by mouth every 6 (six) hours as needed for cough.   ONDANSETRON (ZOFRAN)  4 MG TABLET    Take 1 tablet (4 mg total) by mouth every 8 (eight) hours as needed for nausea or vomiting.  Previous Medications   ALBUTEROL (VENTOLIN HFA) 108 (90 BASE) MCG/ACT INHALER    Inhale 2 puffs into the lungs 4 (four) times daily as needed for wheezing or shortness of breath.   ATORVASTATIN (LIPITOR) 20 MG TABLET    Take 20 mg by mouth every evening.   BUDESON-GLYCOPYRROL-FORMOTEROL (BREZTRI AEROSPHERE) 160-9-4.8 MCG/ACT AERO    Inhale 2 puffs into the lungs 2 (two) times daily.   METFORMIN (GLUCOPHAGE) 500 MG TABLET    Take 500 mg  by mouth 2 (two) times daily.   OMEPRAZOLE (PRILOSEC) 40 MG CAPSULE    Take 1 capsule (40 mg total) by mouth daily.  Modified Medications   No medications on file  Discontinued Medications   No medications on file    Physical Exam:  Vitals:   01/04/23 0806  BP: 138/70  Pulse: 92  Resp: 17  Temp: (!) 97.1 F (36.2 C)  SpO2: 97%  Weight: 204 lb 9.6 oz (92.8 kg)  Height: 5\' 8"  (1.727 m)   Body mass index is 31.11 kg/m. Wt Readings from Last 3 Encounters:  01/04/23 204 lb 9.6 oz (92.8 kg)  12/16/22 201 lb (91.2 kg)  11/24/22 195 lb 3.2 oz (88.5 kg)    Physical Exam Constitutional:      Appearance: Normal appearance.  HENT:     Head: Normocephalic and atraumatic.     Nose:     Comments: Maxillary sinus tenderness+ Post pharyngeal wall erythema + No tonsillar exudates Cardiovascular:     Rate and Rhythm: Normal rate and regular rhythm.     Heart sounds: No murmur heard. Pulmonary:     Effort: Pulmonary effort is normal. No respiratory distress.     Breath sounds: Normal breath sounds. No wheezing.  Abdominal:     General: Bowel sounds are normal. There is no distension.     Tenderness: There is no abdominal tenderness. There is no guarding or rebound.  Musculoskeletal:        General: No swelling or tenderness.  Skin:    General: Skin is dry.  Neurological:     Mental Status: She is alert. Mental status is at baseline.     Sensory: No sensory deficit.     Motor: No weakness.     Labs reviewed: Basic Metabolic Panel: Recent Labs    08/31/22 1206 10/20/22 0849  NA 137 141  K 4.3 4.9  CL 100 103  CO2 27 28  GLUCOSE 111* 119*  BUN 30* 24  CREATININE 1.82* 1.56*  CALCIUM 9.6 9.8  MG 2.0  --    Liver Function Tests: Recent Labs    08/31/22 1206  AST 11*  ALT 12  ALKPHOS 71  BILITOT 0.4  PROT 7.7  ALBUMIN 4.1   No results for input(s): "LIPASE", "AMYLASE" in the last 8760 hours. No results for input(s): "AMMONIA" in the last 8760  hours. CBC: Recent Labs    08/31/22 1206  WBC 7.5  NEUTROABS 5.4  HGB 11.9*  HCT 35.9*  MCV 90.7  PLT 228   Lipid Panel: Recent Labs    10/20/22 0849  CHOL 167  HDL 64  LDLCALC 81  TRIG 128  CHOLHDL 2.6   TSH: No results for input(s): "TSH" in the last 8760 hours. A1C: Lab Results  Component Value Date   HGBA1C 6.6 (H) 10/20/2022  Assessment/Plan  1. Cough, unspecified type Lungs clear  No wheezing  H/o asthma, smoking  Will send augmentin to pharmacy   - SARS-COV-2 RNA,(COVID-19) QUAL NAAT - guaiFENesin-dextromethorphan (ROBITUSSIN DM) 100-10 MG/5ML syrup; Take 5 mLs by mouth every 6 (six) hours as needed for cough.  Dispense: 118 mL; Refill: 0   Diarrhea, unspecified type  Instructed to take probiotics, imodium prn   Otalgia, unspecified laterality TM - no redness, no bulging , no discharge     Acute non-recurrent maxillary sinusitis Rt max tenderness Will start augmentin  Instructed to  take claritin     Other orders - amoxicillin-clavulanate (AUGMENTIN) 875-125 MG tablet; Take 1 tablet by mouth 2 (two) times daily.  Dispense: 20 tablet; Refill: 0 - ondansetron (ZOFRAN) 4 MG tablet; Take 1 tablet (4 mg total) by mouth every 8 (eight) hours as needed for nausea or vomiting.  Dispense: 20 tablet; Refill: 0   No follow-ups on file.:   I spent greater than 40 minutes for the care of this patient in face to face time, chart review, clinical documentation, patient education.

## 2023-01-05 ENCOUNTER — Telehealth: Payer: Self-pay

## 2023-01-05 NOTE — Telephone Encounter (Signed)
Patient called to request results of covid test and emphasized that she is not feeling any better. Patient did pick up medication yesterday and hoped to be feeling a little stronger today. Patient aware covid test is negative and she posed the question, "Well what is wrong with me, why do I fell so bad?"  Please advise

## 2023-01-06 LAB — SARS-COV-2 RNA,(COVID-19) QUALITATIVE NAAT: SARS CoV2 RNA: NOT DETECTED

## 2023-01-06 NOTE — Telephone Encounter (Signed)
Below is Dr.Veludandi's response:  Sheena Sheffield, MD  You20 minutes ago (9:02 AM)    She was given antibiotics for sinus infection, instruct her to take antibiotics, use claritin, robitussin cough syrup, if no improvement even after a week she needs to be seen in clinic   Left detailed message for patient with providers above response

## 2023-03-17 ENCOUNTER — Encounter: Payer: Self-pay | Admitting: Adult Health

## 2023-03-18 ENCOUNTER — Ambulatory Visit: Payer: 59 | Admitting: Adult Health

## 2023-05-11 ENCOUNTER — Ambulatory Visit: Payer: 59 | Admitting: Family

## 2023-05-11 ENCOUNTER — Encounter: Payer: Self-pay | Admitting: Family

## 2023-05-11 VITALS — BP 144/90 | HR 86 | Temp 97.8°F | Resp 20 | Ht 68.0 in | Wt 196.8 lb

## 2023-05-11 DIAGNOSIS — J029 Acute pharyngitis, unspecified: Secondary | ICD-10-CM

## 2023-05-11 DIAGNOSIS — J40 Bronchitis, not specified as acute or chronic: Secondary | ICD-10-CM | POA: Diagnosis not present

## 2023-05-11 LAB — POCT INFLUENZA A/B: Influenza A, POC: NEGATIVE

## 2023-05-11 LAB — POCT RAPID STREP A (OFFICE): Rapid Strep A Screen: NEGATIVE

## 2023-05-11 LAB — POC COVID19 BINAXNOW: SARS Coronavirus 2 Ag: NEGATIVE

## 2023-05-11 MED ORDER — GUAIFENESIN ER 600 MG PO TB12: 600.0000 mg | ORAL_TABLET | Freq: Two times a day (BID) | ORAL | 0 refills | Status: DC

## 2023-05-11 MED ORDER — PREDNISONE 20 MG PO TABS: 40.0000 mg | ORAL_TABLET | Freq: Every day | ORAL | 0 refills | Status: AC

## 2023-05-11 MED ORDER — DOXYCYCLINE HYCLATE 100 MG PO TABS: 100.0000 mg | ORAL_TABLET | Freq: Two times a day (BID) | ORAL | 0 refills | Status: AC

## 2023-05-11 NOTE — Patient Instructions (Addendum)
-   Notify provider  or go to Emergency room if symptoms worsen or fail to improve

## 2023-05-11 NOTE — Progress Notes (Signed)
Provider: Carilyn Goodpasture Therisa Mennella FNP-C  Medina-Vargas, Margit Banda, NP  Patient Care Team: Gillis Santa, NP as PCP - General (Internal Medicine) Eleanora Neighbor, MD as Referring Physician (Gastroenterology) August Luz, MD as Referring Physician (Cardiology) Bethanie Dicker, MD as Referring Physician (Pulmonary Disease)  Extended Emergency Contact Information Primary Emergency Contact: maynard,Cathy Mobile Phone: [phone] Relation: Friend Interpreter needed? No  Code Status: Full Code  Goals of care: Advanced Directive information    05/11/2023   10:14 AM  Advanced Directives  Does Patient Have a Medical Advance Directive? No  Would patient like information on creating a medical advance directive? No - Patient declined     Chief Complaint  Patient presents with   Acute Visit    Sore throat,headache,and bad cough.    HPI:  Pt is a 63 y.o. female seen today for an acute visit for evaluation of cough,sore throat,headache x 4 days.Just does not feel well.Has no appetite.has been drinking a lot of fluids. 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 Took ibuprofen last night for headache which eased off but did not go away. She denies any fever,chills,chest tightness,chest pain,palpitation or shortness of breath.   Past Medical History:  Diagnosis Date   Bronchitis    High blood sugar    High cholesterol    Shortness of breath    Past Surgical History:  Procedure Laterality Date   COLONOSCOPY  2005   LYMPHADENECTOMY Right 1969   SHOULDER SURGERY     THYROIDECTOMY  2016    Allergies  Allergen Reactions   Iohexol      Code: HIVES, Desc: SINGULAR HIVE LT CHEST S/P 13 HR PREP OF PREDNISONE & BENADRYL//A.C.     Outpatient Encounter Medications as of 05/11/2023  Medication Sig   albuterol (VENTOLIN HFA) 108 (90 Base) MCG/ACT inhaler Inhale 2 puffs into the lungs 4 (four) times daily as needed for wheezing or shortness of  breath.   atorvastatin (LIPITOR) 20 MG tablet Take 20 mg by mouth every evening.   Budeson-Glycopyrrol-Formoterol (BREZTRI AEROSPHERE) 160-9-4.8 MCG/ACT AERO Inhale 2 puffs into the lungs 2 (two) times daily.   metFORMIN (GLUCOPHAGE) 500 MG tablet Take 500 mg by mouth 2 (two) times daily.   omeprazole (PRILOSEC) 40 MG capsule Take 1 capsule (40 mg total) by mouth daily.   amoxicillin-clavulanate (AUGMENTIN) 875-125 MG tablet Take 1 tablet by mouth 2 (two) times daily. (Patient not taking: Reported on 05/11/2023)   guaiFENesin-dextromethorphan (ROBITUSSIN DM) 100-10 MG/5ML syrup Take 5 mLs by mouth every 6 (six) hours as needed for cough. (Patient not taking: Reported on 05/11/2023)   ondansetron (ZOFRAN) 4 MG tablet Take 1 tablet (4 mg total) by mouth every 8 (eight) hours as needed for nausea or vomiting. (Patient not taking: Reported on 05/11/2023)   No facility-administered encounter medications on file as of 05/11/2023.    Review of Systems  Constitutional:  Positive for appetite change. Negative for chills, fatigue, fever and unexpected weight change.  HENT:  Positive for rhinorrhea and sore throat. Negative for congestion, dental problem, ear discharge, ear pain, facial swelling, hearing loss, nosebleeds, postnasal drip, sinus pressure, sinus pain, sneezing, tinnitus and trouble swallowing.   Eyes:  Negative for pain, discharge, redness, itching and visual disturbance.  Respiratory:  Positive for cough. Negative for chest tightness, shortness of breath and wheezing.   Cardiovascular:  Negative for chest pain, palpitations and leg swelling.  Gastrointestinal:  Negative for abdominal distention, abdominal pain, blood in  stool, constipation, diarrhea, nausea and vomiting.  Genitourinary:  Negative for difficulty urinating, dysuria, flank pain, frequency and urgency.  Musculoskeletal:  Positive for myalgias. Negative for arthralgias, back pain, gait problem, joint swelling, neck pain and neck  stiffness.  Skin:  Negative for color change, pallor and rash.  Neurological:  Positive for headaches. Negative for dizziness, syncope, speech difficulty, weakness, light-headedness and numbness.    Immunization History  Administered Date(s) Administered   PFIZER Comirnaty(Gray Top)Covid-19 Tri-Sucrose Vaccine 09/10/2019, 10/01/2019   Td 03/26/2004   Pertinent  Health Maintenance Due  Topic Date Due   Colonoscopy  Never done   INFLUENZA VACCINE  Never done   MAMMOGRAM  03/31/2024      09/27/2022    2:20 PM 11/24/2022    2:04 PM 01/04/2023    8:10 AM 05/11/2023   10:13 AM  Fall Risk  Falls in the past year? 0 0 0 0  Was there an injury with Fall? 0 0 0 0  Fall Risk Category Calculator 0 0 0 0  Patient at Risk for Falls Due to  No Fall Risks No Fall Risks   Fall risk Follow up Falls evaluation completed Falls evaluation completed Falls evaluation completed;Education provided;Falls prevention discussed    Functional Status Survey:    Vitals:   05/11/23 1019 05/11/23 1024  BP: (!) 140/80 (!) 144/90  Pulse: 86   Resp: 20   Temp: 97.8 F (36.6 C)   SpO2: 98%   Weight: 196 lb 12.8 oz (89.3 kg)   Height: 5\' 8"  (1.727 m)    Body mass index is 29.92 kg/m. Physical Exam Vitals reviewed.  Constitutional:      General: She is not in acute distress.    Appearance: Normal appearance. She is normal weight. She is not ill-appearing or diaphoretic.  HENT:     Head: Normocephalic.     Right Ear: Tympanic membrane, ear canal and external ear normal. There is no impacted cerumen.     Left Ear: Tympanic membrane, ear canal and external ear normal. There is no impacted cerumen.     Nose: Nose normal. No congestion or rhinorrhea.     Mouth/Throat:     Mouth: Mucous membranes are moist.     Pharynx: Oropharynx is clear. Posterior oropharyngeal erythema present. No oropharyngeal exudate.  Eyes:     General: No scleral icterus.       Right eye: No discharge.        Left eye: No  discharge.     Extraocular Movements: Extraocular movements intact.     Conjunctiva/sclera: Conjunctivae normal.     Pupils: Pupils are equal, round, and reactive to light.  Cardiovascular:     Rate and Rhythm: Normal rate and regular rhythm.     Pulses: Normal pulses.     Heart sounds: Normal heart sounds. No murmur heard.    No friction rub. No gallop.  Pulmonary:     Effort: Pulmonary effort is normal. No respiratory distress.     Breath sounds: Examination of the right-upper field reveals wheezing. Examination of the left-upper field reveals wheezing. Examination of the right-middle field reveals wheezing. Wheezing present. No rhonchi or rales.  Chest:     Chest wall: No tenderness.  Abdominal:     General: Bowel sounds are normal. There is no distension.     Palpations: Abdomen is soft. There is no mass.     Tenderness: There is no abdominal tenderness. There is no right CVA tenderness, left CVA  tenderness, guarding or rebound.  Musculoskeletal:        General: No swelling or tenderness. Normal range of motion.     Cervical back: Normal range of motion. No rigidity or tenderness.     Right lower leg: No edema.     Left lower leg: No edema.  Lymphadenopathy:     Cervical: No cervical adenopathy.  Skin:    General: Skin is warm and dry.     Coloration: Skin is not pale.     Findings: No erythema or rash.  Neurological:     Mental Status: She is alert and oriented to person, place, and time.     Motor: No weakness.     Gait: Gait normal.  Psychiatric:        Mood and Affect: Mood normal.        Speech: Speech normal.        Behavior: Behavior normal.     Labs reviewed: Recent Labs    08/31/22 1206 10/20/22 0849  NA 137 141  K 4.3 4.9  CL 100 103  CO2 27 28  GLUCOSE 111* 119*  BUN 30* 24  CREATININE 1.82* 1.56*  CALCIUM 9.6 9.8  MG 2.0  --    Recent Labs    08/31/22 1206  AST 11*  ALT 12  ALKPHOS 71  BILITOT 0.4  PROT 7.7  ALBUMIN 4.1   Recent Labs     08/31/22 1206  WBC 7.5  NEUTROABS 5.4  HGB 11.9*  HCT 35.9*  MCV 90.7  PLT 228   Lab Results  Component Value Date   TSH 1.40 03/13/2008   Lab Results  Component Value Date   HGBA1C 6.6 (H) 10/20/2022   Lab Results  Component Value Date   CHOL 167 10/20/2022   HDL 64 10/20/2022   LDLCALC 81 10/20/2022   LDLDIRECT 160.3 03/13/2008   TRIG 128 10/20/2022   CHOLHDL 2.6 10/20/2022    Significant Diagnostic Results in last 30 days:  No results found.  Assessment/Plan 1. Sore throat (Primary) Posterior erythema without any exudate noted  - POC Rapid Strep A results negative  - POC COVID-19 results negative  - POC Influenza A/B results negative   2. Bronchitis Bilateral upper lungs wheezes noted on exam Start on Prednisone as below and will cover with antibiotics for Pneumonia  - mucinex for cough PRN - Advised to notify provider or go to ED if symptoms worsen   - POC COVID-19 - POC Influenza A/B - CBC with Differential/Platelet - COMPLETE METABOLIC PANEL WITH GFR - predniSONE (DELTASONE) 20 MG tablet; Take 2 tablets (40 mg total) by mouth daily with breakfast for 5 days.  Dispense: 10 tablet; Refill: 0 - guaiFENesin (MUCINEX) 600 MG 12 hr tablet; Take 1 tablet (600 mg total) by mouth 2 (two) times daily for 10 days.  Dispense: 20 tablet; Refill: 0 - doxycycline (VIBRA-TABS) 100 MG tablet; Take 1 tablet (100 mg total) by mouth 2 (two) times daily for 7 days.  Dispense: 14 tablet; Refill: 0  Family/ staff Communication: Reviewed plan of care with patient verbalized understanding   Labs/tests ordered:  - POC Rapid Strep A - POC COVID-19 - POC Influenza A/B - CBC with Differential/Platelet - COMPLETE METABOLIC PANEL WITH GFR  Next Appointment: Return if symptoms worsen or fail to improve.   Sheena Bookman, NP

## 2023-05-12 LAB — COMPLETE METABOLIC PANEL WITH GFR
AG Ratio: 1.3 (calc) (ref 1.0–2.5)
ALT: 6 U/L (ref 6–29)
AST: 9 U/L — ABNORMAL LOW (ref 10–35)
Albumin: 4.2 g/dL (ref 3.6–5.1)
Alkaline phosphatase (APISO): 86 U/L (ref 37–153)
BUN/Creatinine Ratio: 12 (calc) (ref 6–22)
BUN: 17 mg/dL (ref 7–25)
CO2: 29 mmol/L (ref 20–32)
Calcium: 9.7 mg/dL (ref 8.6–10.4)
Chloride: 103 mmol/L (ref 98–110)
Creat: 1.39 mg/dL — ABNORMAL HIGH (ref 0.50–1.05)
Globulin: 3.2 g/dL (ref 1.9–3.7)
Glucose, Bld: 108 mg/dL (ref 65–139)
Potassium: 4.9 mmol/L (ref 3.5–5.3)
Sodium: 140 mmol/L (ref 135–146)
Total Bilirubin: 0.4 mg/dL (ref 0.2–1.2)
Total Protein: 7.4 g/dL (ref 6.1–8.1)
eGFR: 42 mL/min/{1.73_m2} — ABNORMAL LOW (ref >=60)

## 2023-05-12 LAB — CBC WITH DIFFERENTIAL/PLATELET
Absolute Lymphocytes: 1781 {cells}/uL (ref 850–3900)
Absolute Monocytes: 596 {cells}/uL (ref 200–950)
Basophils Absolute: 34 {cells}/uL (ref 0–200)
Basophils Relative: 0.4 %
Eosinophils Absolute: 92 {cells}/uL (ref 15–500)
Eosinophils Relative: 1.1 %
HCT: 37.2 % (ref 35.0–45.0)
Hemoglobin: 12.5 g/dL (ref 11.7–15.5)
MCH: 30.4 pg (ref 27.0–33.0)
MCHC: 33.6 g/dL (ref 32.0–36.0)
MCV: 90.5 fL (ref 80.0–100.0)
MPV: 9.9 fL (ref 7.5–12.5)
Monocytes Relative: 7.1 %
Neutro Abs: 5897 {cells}/uL (ref 1500–7800)
Neutrophils Relative %: 70.2 %
Platelets: 289 10*3/uL (ref 140–400)
RBC: 4.11 10*6/uL (ref 3.80–5.10)
RDW: 14.5 % (ref 11.0–15.0)
Total Lymphocyte: 21.2 %
WBC: 8.4 10*3/uL (ref 3.8–10.8)

## 2023-05-13 ENCOUNTER — Telehealth: Payer: Self-pay

## 2023-05-13 NOTE — Telephone Encounter (Signed)
Patient is calling a to check the states of lab results

## 2023-05-17 ENCOUNTER — Encounter: Payer: Self-pay | Admitting: Sports Medicine

## 2023-05-17 ENCOUNTER — Ambulatory Visit: Payer: 59 | Admitting: Sports Medicine

## 2023-05-17 ENCOUNTER — Ambulatory Visit
Admission: RE | Admit: 2023-05-17 | Discharge: 2023-05-17 | Disposition: A | Discharge status: Home / Self Care | Payer: 59 | Source: Ambulatory Visit | Attending: Sports Medicine

## 2023-05-17 VITALS — BP 137/78 | HR 100 | Temp 97.7°F | Resp 18 | Ht 68.0 in | Wt 202.4 lb

## 2023-05-17 DIAGNOSIS — J302 Other seasonal allergic rhinitis: Secondary | ICD-10-CM

## 2023-05-17 DIAGNOSIS — K219 Gastro-esophageal reflux disease without esophagitis: Secondary | ICD-10-CM | POA: Diagnosis not present

## 2023-05-17 DIAGNOSIS — R051 Acute cough: Secondary | ICD-10-CM | POA: Diagnosis not present

## 2023-05-17 DIAGNOSIS — J449 Chronic obstructive pulmonary disease, unspecified: Secondary | ICD-10-CM | POA: Diagnosis not present

## 2023-05-17 LAB — CBC WITH DIFFERENTIAL/PLATELET
Absolute Lymphocytes: 2479 {cells}/uL (ref 850–3900)
Absolute Monocytes: 955 {cells}/uL — ABNORMAL HIGH (ref 200–950)
Basophils Absolute: 46 {cells}/uL (ref 0–200)
Basophils Relative: 0.3 %
Eosinophils Absolute: 92 {cells}/uL (ref 15–500)
Eosinophils Relative: 0.6 %
HCT: 38.2 % (ref 35.0–45.0)
Hemoglobin: 12.9 g/dL (ref 11.7–15.5)
MCH: 30.4 pg (ref 27.0–33.0)
MCHC: 33.8 g/dL (ref 32.0–36.0)
MCV: 89.9 fL (ref 80.0–100.0)
MPV: 9.9 fL (ref 7.5–12.5)
Monocytes Relative: 6.2 %
Neutro Abs: 11827 {cells}/uL — ABNORMAL HIGH (ref 1500–7800)
Neutrophils Relative %: 76.8 %
Platelets: 282 10*3/uL (ref 140–400)
RBC: 4.25 10*6/uL (ref 3.80–5.10)
RDW: 14.4 % (ref 11.0–15.0)
Total Lymphocyte: 16.1 %
WBC: 15.4 10*3/uL — ABNORMAL HIGH (ref 3.8–10.8)

## 2023-05-17 MED ORDER — LORATADINE 10 MG PO TABS
10.0000 mg | ORAL_TABLET | Freq: Every day | ORAL | 11 refills | Status: DC
Start: 2023-05-17 — End: 2023-12-08

## 2023-05-17 MED ORDER — FLUTICASONE PROPIONATE 50 MCG/ACT NA SUSP: 2.0000 | Freq: Every day | NASAL | 6 refills | Status: DC

## 2023-05-17 MED ORDER — OMEPRAZOLE 40 MG PO CPDR
40.0000 mg | DELAYED_RELEASE_CAPSULE | Freq: Every day | ORAL | 3 refills | Status: DC
Start: 2023-05-17 — End: 2023-08-31

## 2023-05-17 MED ORDER — BENZONATATE 200 MG PO CAPS: 200.0000 mg | ORAL_CAPSULE | Freq: Two times a day (BID) | ORAL | 0 refills | Status: DC | PRN

## 2023-05-17 NOTE — Progress Notes (Signed)
Careteam: Patient Care Team: Gillis Santa, NP as PCP - General (Internal Medicine) Eleanora Neighbor, MD as Referring Physician (Gastroenterology) August Luz, MD as Referring Physician (Cardiology) Bethanie Dicker, MD as Referring Physician (Pulmonary Disease)  PLACE OF SERVICE:  [facility]  Advanced Directive information    Allergies  Allergen Reactions   Iohexol      Code: HIVES, Desc: SINGULAR HIVE LT CHEST S/P 13 HR PREP OF PREDNISONE & BENADRYL//A.C.     Chief Complaint  Patient presents with   Acute Visit     Patient complains she's still not feeling well and wants to know what can be done. Symptoms are headache, sore throat,fatigue, loss of appetite, and cough.      Discussed the use of AI scribe software for clinical note transcription with the patient, who gave verbal consent to proceed.  History of Present Illness   The patient, with a history of smoking, allergies, and acid reflux, presented with a ten-day history of cough, congestion, and general malaise. She was seen in clinic last week and was prescribed prednisone and doxycycline, which she completed. Despite this, she reported no significant improvement in her symptoms.  The patient described a persistent cough with clear white phlegm, nasal congestion with discharge, and a sore throat. She also reported a headache and toothache. She noted an earache in the right ear, which has been present for the same duration as the other symptoms. However, she denied any significant changes in hearing or any discharge from the ear.  The patient also reported feeling lightheaded, particularly when sitting up or moving around, which has led to increased time spent in bed. She denied any fever but described fluctuations in body temperature. She also reported soft stools but denied any diarrhea.  The patient has a history of smoking for approximately 30 years, consuming a pack of cigarettes daily. She also has a  history of allergies, particularly in the months of January and February, but has not been using any allergy medications or nasal sprays. She has been using an inhaler (Brex) for breathing issues, which she has had for some time, and has an emergency inhaler, which she has only used once in the past ten days.  The patient also has a history of acid reflux, for which she takes omeprazole in the evenings. She reported vomiting every morning, a symptom she has experienced since childhood.    The patient has been prescribed cholesterol   but admits not taking them regularly . She has been taking metformin, her acid reflux medication, and her inhaler regularly.       Review of Systems:  ROS Negative unless indicated in HPI.   Past Medical History:  Diagnosis Date   Bronchitis    High blood sugar    High cholesterol    Shortness of breath    Past Surgical History:  Procedure Laterality Date   COLONOSCOPY  2005   LYMPHADENECTOMY Right 1969   SHOULDER SURGERY     THYROIDECTOMY  2016   Social History:   reports that she has been smoking cigarettes. She has never used smokeless tobacco. She reports that she does not currently use alcohol. She reports that she does not use drugs.  Family History  Problem Relation Age of Onset   Colon cancer Mother    Heart disease Father    Breast cancer Sister    Liver disease Sister    Liver disease Brother    Diabetes Brother  Glaucoma Brother     Medications: Patient's Medications  New Prescriptions   No medications on file  Previous Medications   ALBUTEROL (VENTOLIN HFA) 108 (90 BASE) MCG/ACT INHALER    Inhale 2 puffs into the lungs 4 (four) times daily as needed for wheezing or shortness of breath.   ATORVASTATIN (LIPITOR) 20 MG TABLET    Take 20 mg by mouth every evening.   BUDESON-GLYCOPYRROL-FORMOTEROL (BREZTRI AEROSPHERE) 160-9-4.8 MCG/ACT AERO    Inhale 2 puffs into the lungs 2 (two) times daily.   DOXYCYCLINE (VIBRA-TABS) 100 MG  TABLET    Take 1 tablet (100 mg total) by mouth 2 (two) times daily for 7 days.   GUAIFENESIN (MUCINEX) 600 MG 12 HR TABLET    Take 1 tablet (600 mg total) by mouth 2 (two) times daily for 10 days.   METFORMIN (GLUCOPHAGE) 500 MG TABLET    Take 500 mg by mouth 2 (two) times daily.   OMEPRAZOLE (PRILOSEC) 40 MG CAPSULE    Take 1 capsule (40 mg total) by mouth daily.   ONDANSETRON (ZOFRAN) 4 MG TABLET    Take 1 tablet (4 mg total) by mouth every 8 (eight) hours as needed for nausea or vomiting.  Modified Medications   No medications on file  Discontinued Medications   No medications on file    Physical Exam: Vitals:   05/17/23 0919  BP: 137/78  Pulse: 100  Resp: 18  Temp: 97.7 F (36.5 C)  SpO2: 96%  Weight: 202 lb 6.4 oz (91.8 kg)  Height: 5\' 8"  (1.727 m)   Body mass index is 30.77 kg/m. BP Readings from Last 3 Encounters:  05/17/23 137/78  05/11/23 (!) 144/90  01/04/23 138/70   Wt Readings from Last 3 Encounters:  05/17/23 202 lb 6.4 oz (91.8 kg)  05/11/23 196 lb 12.8 oz (89.3 kg)  01/04/23 204 lb 9.6 oz (92.8 kg)    Physical Exam  Labs reviewed: Basic Metabolic Panel: Recent Labs    08/31/22 1206 10/20/22 0849 05/11/23 1131  NA 137 141 140  K 4.3 4.9 4.9  CL 100 103 103  CO2 27 28 29   GLUCOSE 111* 119* 108  BUN 30* 24 17  CREATININE 1.82* 1.56* 1.39*  CALCIUM 9.6 9.8 9.7  MG 2.0  --   --    Liver Function Tests: Recent Labs    08/31/22 1206 05/11/23 1131  AST 11* 9*  ALT 12 6  ALKPHOS 71  --   BILITOT 0.4 0.4  PROT 7.7 7.4  ALBUMIN 4.1  --    No results for input(s): "LIPASE", "AMYLASE" in the last 8760 hours. No results for input(s): "AMMONIA" in the last 8760 hours. CBC: Recent Labs    08/31/22 1206 05/11/23 1131  WBC 7.5 8.4  NEUTROABS 5.4 5,897  HGB 11.9* 12.5  HCT 35.9* 37.2  MCV 90.7 90.5  PLT 228 289   Lipid Panel: Recent Labs    10/20/22 0849  CHOL 167  HDL 64  LDLCALC 81  TRIG 128  CHOLHDL 2.6   TSH: No results for  input(s): "TSH" in the last 8760 hours. A1C: Lab Results  Component Value Date   HGBA1C 6.6 (H) 10/20/2022     Assessment/Plan  1. Chronic obstructive pulmonary disease, unspecified COPD type (HCC) (Primary)  Persistent cough, congestion, and sore throat for 10 days. No improvement with doxycycline and prednisone. No fever. Clear sputum. Nasal congestion and discharge. Sinus pain and earache. -Order chest X-ray and repeat white blood cell  count. - DG Chest 2 View; Future - CBC With Differential/Platelet  2. Acute cough  Lungs clear  Pt reports no improvement in her symptoms  Will start tessalon  Instructed to use flonase Will start claritin  - DG Chest 2 View; Future - CBC With Differential/Platelet  Allergic rhinitis  Prescribe loratadine for allergy symptoms. -Advise to use Flonase nasal spray daily. - fluticasone (FLONASE) 50 MCG/ACT nasal spray; Place 2 sprays into both nostrils daily.  Dispense: 16 g; Refill: 6 - loratadine (CLARITIN) 10 MG tablet; Take 1 tablet (10 mg total) by mouth daily.  Dispense: 30 tablet; Refill: 11    Gastroesophageal reflux disease, unspecified whether esophagitis present  History of acid reflux with daily omeprazole use. Morning nausea and vomiting. -Advise to take omeprazole twice daily. - omeprazole (PRILOSEC) 40 MG capsule; Take 1 capsule (40 mg total) by mouth daily.  Dispense: 30 capsule; Refill: 3  Other orders - benzonatate (TESSALON) 200 MG capsule; Take 1 capsule (200 mg total) by mouth 2 (two) times daily as needed for cough.  Dispense: 20 capsule; Refill: 0   30 min Total time spent for obtaining history,  performing a medically appropriate examination and evaluation, reviewing the tests,ordering  tests,  documenting clinical information in the electronic or other health record,  ,care coordination (not separately reported)

## 2023-05-17 NOTE — Patient Instructions (Signed)
UPPER RESPIRATORY INFECTION: An upper respiratory infection is a condition that affects the nose, throat, and airways. We will order a chest X-ray and repeat your white blood cell count to check for any underlying issues. Your cough medication will be changed, and you are prescribed Flonase nasal spray to help with sinus drainage. Continue taking omeprazole twice daily for acid reflux.  -CHRONIC OBSTRUCTIVE PULMONARY DISEASE (COPD): COPD is a chronic lung disease often caused by smoking, leading to breathing difficulties. Continue using your daily inhaler (Brex) and use your emergency inhaler as needed. We plan to repeat your CT scan in June or July to monitor your lung condition.  -GASTROESOPHAGEAL REFLUX DISEASE (GERD): GERD is a digestive disorder where stomach acid frequently flows back into the esophagus. You should take omeprazole twice daily and gargle with salt water to relieve your sore throat.  -ALLERGIC RHINITIS: Allergic rhinitis is an allergic reaction that causes sneezing, congestion, and a runny nose. You are prescribed loratadine for your allergy symptoms and advised to use Flonase nasal spray daily.

## 2023-05-19 ENCOUNTER — Other Ambulatory Visit (HOSPITAL_BASED_OUTPATIENT_CLINIC_OR_DEPARTMENT_OTHER): Payer: Self-pay

## 2023-05-19 ENCOUNTER — Emergency Department (HOSPITAL_BASED_OUTPATIENT_CLINIC_OR_DEPARTMENT_OTHER)
Admission: EM | Admit: 2023-05-19 | Discharge: 2023-05-19 | Disposition: A | Discharge status: Home / Self Care | Payer: 59 | Attending: Emergency Medicine | Admitting: Emergency Medicine

## 2023-05-19 ENCOUNTER — Other Ambulatory Visit: Payer: Self-pay

## 2023-05-19 ENCOUNTER — Encounter (HOSPITAL_BASED_OUTPATIENT_CLINIC_OR_DEPARTMENT_OTHER): Payer: Self-pay | Admitting: Emergency Medicine

## 2023-05-19 ENCOUNTER — Telehealth: Payer: Self-pay

## 2023-05-19 ENCOUNTER — Emergency Department (HOSPITAL_BASED_OUTPATIENT_CLINIC_OR_DEPARTMENT_OTHER): Payer: 59 | Admitting: Radiology

## 2023-05-19 DIAGNOSIS — J040 Acute laryngitis: Secondary | ICD-10-CM | POA: Diagnosis not present

## 2023-05-19 DIAGNOSIS — R490 Dysphonia: Secondary | ICD-10-CM | POA: Insufficient documentation

## 2023-05-19 DIAGNOSIS — J441 Chronic obstructive pulmonary disease with (acute) exacerbation: Secondary | ICD-10-CM | POA: Diagnosis not present

## 2023-05-19 DIAGNOSIS — R519 Headache, unspecified: Secondary | ICD-10-CM | POA: Diagnosis not present

## 2023-05-19 DIAGNOSIS — F17210 Nicotine dependence, cigarettes, uncomplicated: Secondary | ICD-10-CM | POA: Insufficient documentation

## 2023-05-19 DIAGNOSIS — H9209 Otalgia, unspecified ear: Secondary | ICD-10-CM | POA: Diagnosis not present

## 2023-05-19 DIAGNOSIS — J40 Bronchitis, not specified as acute or chronic: Secondary | ICD-10-CM

## 2023-05-19 DIAGNOSIS — R0602 Shortness of breath: Secondary | ICD-10-CM | POA: Diagnosis present

## 2023-05-19 HISTORY — DX: Chronic obstructive pulmonary disease, unspecified: J44.9

## 2023-05-19 LAB — CBC
HCT: 36.1 % (ref 36.0–46.0)
Hemoglobin: 12.1 g/dL (ref 12.0–15.0)
MCH: 30.6 pg (ref 26.0–34.0)
MCHC: 33.5 g/dL (ref 30.0–36.0)
MCV: 91.4 fL (ref 80.0–100.0)
Platelets: 237 10*3/uL (ref 150–400)
RBC: 3.95 MIL/uL (ref 3.87–5.11)
RDW: 15.9 % — ABNORMAL HIGH (ref 11.5–15.5)
WBC: 12.1 10*3/uL — ABNORMAL HIGH (ref 4.0–10.5)
nRBC: 0 % (ref 0.0–0.2)

## 2023-05-19 LAB — BASIC METABOLIC PANEL
Anion gap: 8 (ref 5–15)
BUN: 22 mg/dL (ref 8–23)
CO2: 29 mmol/L (ref 22–32)
Calcium: 9.4 mg/dL (ref 8.9–10.3)
Chloride: 99 mmol/L (ref 98–111)
Creatinine, Ser: 1.48 mg/dL — ABNORMAL HIGH (ref 0.44–1.00)
GFR, Estimated: 39 mL/min — ABNORMAL LOW (ref >60)
Glucose, Bld: 146 mg/dL — ABNORMAL HIGH (ref 70–99)
Potassium: 4.4 mmol/L (ref 3.5–5.1)
Sodium: 136 mmol/L (ref 135–145)

## 2023-05-19 MED ORDER — IPRATROPIUM-ALBUTEROL 0.5-2.5 (3) MG/3ML IN SOLN
3.0000 mL | Freq: Once | RESPIRATORY_TRACT | Status: AC
  Administered 2023-05-19: 3 mL via RESPIRATORY_TRACT
  Filled 2023-05-19: qty 3

## 2023-05-19 MED ORDER — PREDNISONE 50 MG PO TABS
60.0000 mg | ORAL_TABLET | Freq: Once | ORAL | Status: AC
  Administered 2023-05-19: 60 mg via ORAL
  Filled 2023-05-19: qty 1

## 2023-05-19 MED ORDER — ALBUTEROL SULFATE (2.5 MG/3ML) 0.083% IN NEBU
2.5000 mg | INHALATION_SOLUTION | Freq: Once | RESPIRATORY_TRACT | Status: AC
  Administered 2023-05-19: 2.5 mg via RESPIRATORY_TRACT
  Filled 2023-05-19: qty 3

## 2023-05-19 MED ORDER — RACEPINEPHRINE HCL 2.25 % IN NEBU
0.5000 mL | INHALATION_SOLUTION | Freq: Once | RESPIRATORY_TRACT | Status: AC
  Administered 2023-05-19: 0.5 mL via RESPIRATORY_TRACT
  Filled 2023-05-19: qty 0.5

## 2023-05-19 MED ORDER — AEROCHAMBER PLUS FLO-VU MISC
1.0000 | Freq: Once | Status: AC
  Administered 2023-05-19: 1
  Filled 2023-05-19: qty 1

## 2023-05-19 MED ORDER — PREDNISONE 10 MG PO TABS
ORAL_TABLET | ORAL | 0 refills | Status: AC
  Filled 2023-05-19: qty 30, 10d supply, fill #0

## 2023-05-19 MED ORDER — GUAIFENESIN ER 600 MG PO TB12
600.0000 mg | ORAL_TABLET | Freq: Two times a day (BID) | ORAL | 0 refills | Status: AC
  Filled 2023-05-19: qty 20, 10d supply, fill #0

## 2023-05-19 NOTE — Discharge Instructions (Addendum)
Continue your inhalers that you take at home.  Add the steroids to help with the inflammation.  Try to quit smoking.  Follow-up with your primary care doctor to be rechecked.  I also commend following up with an ENT doctor for further evaluation as we discussed

## 2023-05-19 NOTE — ED Triage Notes (Signed)
Pt has 2weeks hx cough, malaise, short of breath, has seen MD twice, completed coarse of antibiotics and prednisone, no better. She is short of breath in triage.

## 2023-05-19 NOTE — ED Provider Notes (Signed)
[place] EMERGENCY DEPARTMENT AT [facility] Provider Note   CSN: [id number] Arrival date & time: 05/19/23  4098     History  CC: Shortness of breath  Sheena Arias is a 63 y.o. female.  HPI   Patient has a history of COPD hypercholesterolemia bronchitis and chronic cigarette use.  Patient has seen her primary care doctor.  She has been given prescriptions for steroids and doxycycline.  Patient was last seen in the office 2 days ago.  She does not feel like she is getting any better.  She does have some headaches as well as earaches.  She also has noticed some hoarseness in her voice.  Home Medications Prior to Admission medications   Medication Sig Start Date End Date Taking? Authorizing Provider  predniSONE (DELTASONE) 10 MG tablet Take 5 tablets (50 mg total) by mouth daily with breakfast for 2 days, THEN 4 tablets (40 mg total) daily with breakfast for 2 days, THEN 3 tablets (30 mg total) daily with breakfast for 2 days, THEN 2 tablets (20 mg total) daily with breakfast for 2 days, THEN 1 tablet (10 mg total) daily with breakfast for 2 days. 05/19/23 05/29/23 Yes Linwood Dibbles, MD  albuterol (VENTOLIN HFA) 108 (90 Base) MCG/ACT inhaler Inhale 2 puffs into the lungs 4 (four) times daily as needed for wheezing or shortness of breath. 09/27/22   Medina-Vargas, Monina C, NP  atorvastatin (LIPITOR) 20 MG tablet Take 20 mg by mouth every evening. 08/14/22   [provider]  benzonatate (TESSALON) 200 MG capsule Take 1 capsule (200 mg total) by mouth 2 (two) times daily as needed for cough. 05/17/23   Venita Sheffield, MD  Budeson-Glycopyrrol-Formoterol (BREZTRI AEROSPHERE) 160-9-4.8 MCG/ACT AERO Inhale 2 puffs into the lungs 2 (two) times daily. 09/27/22   Medina-Vargas, Monina C, NP  fluticasone (FLONASE) 50 MCG/ACT nasal spray Place 2 sprays into both nostrils daily. 05/17/23   Venita Sheffield, MD  guaiFENesin (MUCINEX) 600 MG 12 hr tablet Take 1 tablet (600 mg total)  by mouth 2 (two) times daily for 10 days. 05/19/23 05/29/23  Linwood Dibbles, MD  loratadine (CLARITIN) 10 MG tablet Take 1 tablet (10 mg total) by mouth daily. 05/17/23   Venita Sheffield, MD  metFORMIN (GLUCOPHAGE) 500 MG tablet Take 500 mg by mouth 2 (two) times daily. 08/14/22   [provider]  omeprazole (PRILOSEC) 40 MG capsule Take 1 capsule (40 mg total) by mouth daily. 05/17/23   Venita Sheffield, MD      Allergies    Iohexol    Review of Systems   Review of Systems  Physical Exam Updated Vital Signs BP (!) 156/83   Pulse 99   Temp 98.3 F (36.8 C)   Resp 18   SpO2 94%  Physical Exam Vitals and nursing note reviewed.  Constitutional:      Appearance: She is well-developed. She is not diaphoretic.  HENT:     Head: Normocephalic and atraumatic.     Right Ear: Tympanic membrane and external ear normal.     Left Ear: Tympanic membrane and external ear normal.     Mouth/Throat:     Pharynx: No oropharyngeal exudate or posterior oropharyngeal erythema.  Eyes:     General: No scleral icterus.       Right eye: No discharge.        Left eye: No discharge.     Conjunctiva/sclera: Conjunctivae normal.  Neck:     Trachea: No tracheal deviation.  Cardiovascular:  Rate and Rhythm: Normal rate and regular rhythm.  Pulmonary:     Effort: No respiratory distress.     Breath sounds: No stridor. Wheezing and rhonchi present. No rales.     Comments: Hoarseness of the voice noted, Abdominal:     General: Bowel sounds are normal. There is no distension.     Palpations: Abdomen is soft.     Tenderness: There is no abdominal tenderness. There is no guarding or rebound.  Musculoskeletal:        General: No tenderness or deformity.     Cervical back: Neck supple.  Skin:    General: Skin is warm and dry.     Findings: No rash.  Neurological:     General: No focal deficit present.     Mental Status: She is alert.     Cranial Nerves: No cranial nerve deficit,  dysarthria or facial asymmetry.     Sensory: No sensory deficit.     Motor: No abnormal muscle tone or seizure activity.     Coordination: Coordination normal.  Psychiatric:        Mood and Affect: Mood normal.     ED Results / Procedures / Treatments   Labs (all labs ordered are listed, but only abnormal results are displayed) Labs Reviewed  CBC - Abnormal; Notable for the following components:      Result Value   WBC 12.1 (*)    RDW 15.9 (*)    All other components within normal limits  BASIC METABOLIC PANEL - Abnormal; Notable for the following components:   Glucose, Bld 146 (*)    Creatinine, Ser 1.48 (*)    GFR, Estimated 39 (*)    All other components within normal limits    EKG None  Radiology DG Chest 2 View Result Date: 05/19/2023 CLINICAL DATA:  Dyspnea. EXAM: CHEST - 2 VIEW COMPARISON:  05/17/2023. FINDINGS: Bilateral lung fields are clear. Bilateral costophrenic angles are clear. Normal cardio-mediastinal silhouette. No acute osseous abnormalities. The soft tissues are within normal limits. IMPRESSION: No active cardiopulmonary disease. Electronically Signed   By: Jules Schick M.D.   On: 05/19/2023 11:34    Procedures Procedures    Medications Ordered in ED Medications  Aerochamber Plus device 1 each (has no administration in time range)  ipratropium-albuterol (DUONEB) 0.5-2.5 (3) MG/3ML nebulizer solution 3 mL (3 mLs Nebulization Given 05/19/23 1002)  albuterol (PROVENTIL) (2.5 MG/3ML) 0.083% nebulizer solution 2.5 mg (2.5 mg Nebulization Given 05/19/23 1002)  predniSONE (DELTASONE) tablet 60 mg (60 mg Oral Given 05/19/23 1043)  Racepinephrine HCl 2.25 % nebulizer solution 0.5 mL (0.5 mLs Nebulization Given 05/19/23 1028)    ED Course/ Medical Decision Making/ A&P Clinical Course as of 05/19/23 1224  Thu May 19, 2023  0945 Negative covid flu testing on 1/15 [JK]  1141 Chest x-ray without acute findings [JK]  1200 CBC shows slight increase in white blood  cell count.  Metabolic panel shows creatinine increased at 1.48, similar to previous values [JK]    Clinical Course User Index [JK] Linwood Dibbles, MD                                 Medical Decision Making Problems Addressed: COPD exacerbation [facility]): acute illness or injury that poses a threat to life or bodily functions Laryngitis: acute illness or injury that poses a threat to life or bodily functions  Amount and/or Complexity of Data Reviewed Labs:  ordered. Decision-making details documented in ED Course. Radiology: ordered and independent interpretation performed.  Risk OTC drugs. Prescription drug management.   Patient presented to the ED for evaluation of persistent cough and congestion.  Patient has been seen as an outpatient for this but her symptoms have persisted.  Patient has taken antibiotics and steroids but her symptoms persist.  Patient unfortunately does continue to smoke.  She has not been taking her maintenance inhalers regularly.  On exam she definitely had a component of wheezing but also thought there was an upper airway component.  Patient is not having any throat pain.  She is able to speak without any difficulty.  Patient had some improvement with breathing treatments.  She does not have an oxygen requirement.  She appears appropriate for outpatient management.  Suspect this is a combination of COPD and possibly laryngitis.  With her smoking history I do think it be helpful for her to follow-up with ENT to rule out any type of gel mass or polyp.       Final Clinical Impression(s) / ED Diagnoses Final diagnoses:  COPD exacerbation (HCC)  Laryngitis    Rx / DC Orders ED Discharge Orders          Ordered    predniSONE (DELTASONE) 10 MG tablet  Q breakfast        05/19/23 1218    guaiFENesin (MUCINEX) 600 MG 12 hr tablet  2 times daily        05/19/23 1219              Linwood Dibbles, MD 05/19/23 1224

## 2023-05-19 NOTE — Telephone Encounter (Signed)
Patient called and just wanted to inform PCP Medina-Vargas, Monina C, NP that she's not feeling any better so she's going to go to the ER as advised when she was previously seen in office. Patient sounded short of breath and was coughing when we spoke. I asked patient if she was sure she could make it to the ER by herself and she confirmed that she would go to the ER herself. I advised patient to call EMS if she felt like she was unable to take herself. Patient said she would take precautions to call them if she felt like she couldn't make it. Patient states she's going to [facility]. Message routed to PCP as FYI.

## 2023-05-19 NOTE — ED Notes (Signed)
Pt receiving education from RT.

## 2023-05-20 ENCOUNTER — Telehealth: Payer: Self-pay

## 2023-05-20 ENCOUNTER — Other Ambulatory Visit: Payer: Self-pay | Admitting: Sports Medicine

## 2023-05-20 DIAGNOSIS — D72829 Elevated white blood cell count, unspecified: Secondary | ICD-10-CM

## 2023-05-20 NOTE — Transitions of Care (Post Inpatient/ED Visit) (Signed)
   05/20/2023  Name: ALIN HUTCHINS MRN: [id number] DOB: 10-Apr-1961  Today's TOC FU Call Status:    Attempted to reach the patient regarding the most recent Inpatient/ED visit.  Follow Up Plan: Additional outreach attempts will be made to reach the patient to complete the Transitions of Care (Post Inpatient/ED visit) call.   Signature : Hila Bolding.D/RMA

## 2023-05-30 ENCOUNTER — Other Ambulatory Visit: Payer: 59

## 2023-05-31 ENCOUNTER — Encounter: Payer: Self-pay | Admitting: Sports Medicine

## 2023-05-31 LAB — CBC WITH DIFFERENTIAL/PLATELET
Absolute Lymphocytes: 1912 {cells}/uL (ref 850–3900)
Absolute Monocytes: 496 {cells}/uL (ref 200–950)
Basophils Absolute: 35 {cells}/uL (ref 0–200)
Basophils Relative: 0.3 %
Eosinophils Absolute: 94 {cells}/uL (ref 15–500)
Eosinophils Relative: 0.8 %
HCT: 36.6 % (ref 35.0–45.0)
Hemoglobin: 12.1 g/dL (ref 11.7–15.5)
MCH: 30.2 pg (ref 27.0–33.0)
MCHC: 33.1 g/dL (ref 32.0–36.0)
MCV: 91.3 fL (ref 80.0–100.0)
MPV: 9.9 fL (ref 7.5–12.5)
Monocytes Relative: 4.2 %
Neutro Abs: 9263 {cells}/uL — ABNORMAL HIGH (ref 1500–7800)
Neutrophils Relative %: 78.5 %
Platelets: 262 10*3/uL (ref 140–400)
RBC: 4.01 10*6/uL (ref 3.80–5.10)
RDW: 14.3 % (ref 11.0–15.0)
Total Lymphocyte: 16.2 %
WBC: 11.8 10*3/uL — ABNORMAL HIGH (ref 3.8–10.8)

## 2023-07-01 ENCOUNTER — Other Ambulatory Visit: Payer: Self-pay | Admitting: Adult Health

## 2023-07-01 DIAGNOSIS — J432 Centrilobular emphysema: Secondary | ICD-10-CM

## 2023-08-24 ENCOUNTER — Encounter: Payer: Self-pay | Admitting: Adult Health

## 2023-08-24 ENCOUNTER — Ambulatory Visit: Admitting: Adult Health

## 2023-08-24 ENCOUNTER — Ambulatory Visit: Payer: Self-pay

## 2023-08-24 VITALS — BP 128/84 | HR 81 | Temp 97.8°F | Ht 68.0 in | Wt 204.6 lb

## 2023-08-24 DIAGNOSIS — E1122 Type 2 diabetes mellitus with diabetic chronic kidney disease: Secondary | ICD-10-CM | POA: Diagnosis not present

## 2023-08-24 DIAGNOSIS — K219 Gastro-esophageal reflux disease without esophagitis: Secondary | ICD-10-CM

## 2023-08-24 DIAGNOSIS — N1832 Chronic kidney disease, stage 3b: Secondary | ICD-10-CM

## 2023-08-24 DIAGNOSIS — E785 Hyperlipidemia, unspecified: Secondary | ICD-10-CM

## 2023-08-24 DIAGNOSIS — J440 Chronic obstructive pulmonary disease with acute lower respiratory infection: Secondary | ICD-10-CM

## 2023-08-24 DIAGNOSIS — J209 Acute bronchitis, unspecified: Secondary | ICD-10-CM

## 2023-08-24 LAB — POC COVID19 BINAXNOW
Covid Antigen, POC: NEGATIVE
SARS Coronavirus 2 Ag: NEGATIVE

## 2023-08-24 MED ORDER — PREDNISONE 20 MG PO TABS: ORAL_TABLET | ORAL | 0 refills | Status: AC

## 2023-08-24 MED ORDER — DOXYCYCLINE HYCLATE 100 MG PO TABS: 100.0000 mg | ORAL_TABLET | Freq: Two times a day (BID) | ORAL | 0 refills | Status: AC

## 2023-08-24 NOTE — Telephone Encounter (Signed)
 Patient added to scheduled today 08/24/2023. Message routed as FYI

## 2023-08-24 NOTE — Telephone Encounter (Signed)
 Copied from CRM [id number]. Topic: Clinical - Red Word Triage >> Aug 24, 2023  8:57 AM Arlie Benedict B wrote: [facility] that prompted transfer to Nurse Triage: diarrhea, congested, sore throat, no appetite, hot/cold    Chief Complaint: Cough, head congestion, chills, SOB, wheezing Symptoms: Above Frequency: Monday Pertinent Negatives: Patient denies  Disposition: [] ED /[] Urgent Care (no appt availability in office) / [x] Appointment(In office/virtual)/ []  [facility]/ [] Home Care/ [] Refused Recommended Disposition /[] [place] [facility]/ []  Follow-up with PCP Additional Notes: Agrees with appointment.  Reason for Disposition  [1] MILD difficulty breathing (e.g., minimal/no SOB at rest, SOB with walking, pulse <100) AND [2] still present when not coughing  Answer Assessment - Initial Assessment Questions 1. ONSET: "When did the cough begin?"      Monday 2. SEVERITY: "How bad is the cough today?"      Moderate 3. SPUTUM: "Describe the color of your sputum" (none, dry cough; clear, white, yellow, green)     Clear 4. HEMOPTYSIS: "Are you coughing up any blood?" If so ask: "How much?" (flecks, streaks, tablespoons, etc.)     No 5. DIFFICULTY BREATHING: "Are you having difficulty breathing?" If Yes, ask: "How bad is it?" (e.g., mild, moderate, severe)    - MILD: No SOB at rest, mild SOB with walking, speaks normally in sentences, can lie down, no retractions, pulse < 100.    - MODERATE: SOB at rest, SOB with minimal exertion and prefers to sit, cannot lie down flat, speaks in phrases, mild retractions, audible wheezing, pulse 100-120.    - SEVERE: Very SOB at rest, speaks in single words, struggling to breathe, sitting hunched forward, retractions, pulse > 120      Mild 6. FEVER: "Do you have a fever?" If Yes, ask: "What is your temperature, how was it measured, and when did it start?"     Chills 7. CARDIAC HISTORY: "Do you have any history of heart disease?" (e.g., heart attack,  congestive heart failure)      no 8. LUNG HISTORY: "Do you have any history of lung disease?"  (e.g., pulmonary embolus, asthma, emphysema)     Yes 9. PE RISK FACTORS: "Do you have a history of blood clots?" (or: recent major surgery, recent prolonged travel, bedridden)     no 10. OTHER SYMPTOMS: "Do you have any other symptoms?" (e.g., runny nose, wheezing, chest pain)       Wheezing, runny nose, chills 11. PREGNANCY: "Is there any chance you are pregnant?" "When was your last menstrual period?"       *No Answer* 12. TRAVEL: "Have you traveled out of the country in the last month?" (e.g., travel history, exposures)       *No Answer*  Protocols used: Cough - Acute Productive-A-AH

## 2023-08-24 NOTE — Progress Notes (Signed)
 [facility] clinic  Provider:  Inge Mangle DNP  Code Status:  Full Code  Goals of Care:     05/19/2023    9:45 AM  Advanced Directives  Does Patient Have a Medical Advance Directive? No  Would patient like information on creating a medical advance directive? No - Patient declined     Chief Complaint  Patient presents with   Cough    Cough ans congestion  headaches, vomiting, some diaherra  , wheezing she started on Monday. Pt hasn't taken any COViD  TEST. She hasn't taken anything for this      Discussed the use of AI scribe software for clinical note transcription with the patient, who gave verbal consent to proceed.  HPI: [name] seen today for an acute visit for cough.  She has been experiencing symptoms for three days, including a constant headache, sore throat, fatigue, and reduced appetite. Her diet has mainly consisted of fluids, and she has been taking Tylenol to manage her symptoms, ensuring not to take it on an empty stomach. She also reports diarrhea and occasional coughing, especially when talking, and has not tested for COVID-19 yet.  She has a history of respiratory issues, including COPD, and uses Breztri  inhaler twice daily and an albuterol  inhaler as needed. She experiences wheezing, particularly at night, and sleeps with her head elevated on three pillows due to congestion. She smokes about a pack of cigarettes a day, which may be affecting her respiratory health.  She has a history of diabetes, with an A1c of 6.6 ten months ago, and chronic kidney disease stage 3B. She is not currently taking metformin or atorvastatin, although she was previously on these medications. She reports no appetite and has not eaten much recently, only managing a small amount of a tuna fish sandwich the previous day.  She has a history of GERD and takes acid reflux medication nightly. She also mentions having a chest X-ray in January, which was clear, and has  experienced similar symptoms in the past. She feels significant fatigue and a desire to rest, feeling 'tired of being sick'.  In the review of symptoms, she denies fever but reports feeling hot and cold. She has clear phlegm when coughing and nasal discharge, particularly in the mornings. No significant night sweats or unusual chills. She notes that several colleagues at work have been sick recently, suggesting a possible viral infection circulating.     Past Medical History:  Diagnosis Date   Bronchitis    COPD (chronic obstructive pulmonary disease) (HCC)    High blood sugar    High cholesterol    Shortness of breath     Past Surgical History:  Procedure Laterality Date   COLONOSCOPY  2005   LYMPHADENECTOMY Right 1969   SHOULDER SURGERY     THYROIDECTOMY  2016    Allergies  Allergen Reactions   Iohexol      Code: HIVES, Desc: SINGULAR HIVE LT CHEST S/P 13 HR PREP OF PREDNISONE  & BENADRYL//A.C.     Outpatient Encounter Medications as of 08/24/2023  Medication Sig   albuterol  (VENTOLIN  HFA) 108 (90 Base) MCG/ACT inhaler Inhale 2 puffs into the lungs 4 (four) times daily as needed for wheezing or shortness of breath.   BREZTRI  AEROSPHERE 160-9-4.8 MCG/ACT AERO INHALE 2 PUFFS INTO THE LUNGS TWICE DAILY   doxycycline  (VIBRA -TABS) 100 MG tablet Take 1 tablet (100 mg total) by mouth 2 (two) times daily for 10 days.   fluticasone  (  FLONASE ) 50 MCG/ACT nasal spray Place 2 sprays into both nostrils daily.   loratadine  (CLARITIN ) 10 MG tablet Take 1 tablet (10 mg total) by mouth daily.   omeprazole  (PRILOSEC) 40 MG capsule Take 1 capsule (40 mg total) by mouth daily.   predniSONE  (DELTASONE ) 20 MG tablet Take 1 tablet (20 mg total) by mouth 2 (two) times daily for 3 days, THEN 1 tablet (20 mg total) daily with breakfast for 3 days, THEN 1 tablet (20 mg total) daily with breakfast for 3 days.   atorvastatin (LIPITOR) 20 MG tablet Take 20 mg by mouth every evening. (Patient not taking:  Reported on 08/24/2023)   benzonatate  (TESSALON ) 200 MG capsule Take 1 capsule (200 mg total) by mouth 2 (two) times daily as needed for cough. (Patient not taking: Reported on 08/24/2023)   metFORMIN (GLUCOPHAGE) 500 MG tablet Take 500 mg by mouth 2 (two) times daily. (Patient not taking: Reported on 08/24/2023)   No facility-administered encounter medications on file as of 08/24/2023.    Review of Systems:  Review of Systems  Constitutional:  Negative for appetite change, chills, fatigue and fever.  HENT:  Negative for congestion, hearing loss, rhinorrhea and sore throat.   Eyes: Negative.   Respiratory:  Positive for shortness of breath and wheezing. Negative for cough.   Cardiovascular:  Negative for chest pain, palpitations and leg swelling.  Gastrointestinal:  Negative for abdominal pain, constipation, diarrhea, nausea and vomiting.  Genitourinary:  Negative for dysuria.  Musculoskeletal:  Negative for arthralgias, back pain and myalgias.  Skin:  Negative for color change, rash and wound.  Neurological:  Negative for dizziness, weakness and headaches.  Psychiatric/Behavioral:  Negative for behavioral problems. The patient is not nervous/anxious.     Health Maintenance  Topic Date Due   Pneumococcal Vaccine 65-33 Years old (1 of 2 - PCV) Never done   Cervical Cancer Screening (HPV/Pap Cotest)  Never done   Colonoscopy  Never done   Zoster Vaccines- Shingrix (1 of 2) Never done   DTaP/Tdap/Td (2 - Tdap) 03/26/2014   COVID-19 Vaccine (3 - 2024-25 season) 12/26/2022   INFLUENZA VACCINE  11/25/2023   Diabetic kidney evaluation - Urine ACR  12/16/2023   MAMMOGRAM  03/31/2024   Diabetic kidney evaluation - eGFR measurement  05/18/2024   Hepatitis C Screening  Completed   HIV Screening  Completed   HPV VACCINES  Aged Out   Meningococcal B Vaccine  Aged Out    Physical Exam: Vitals:   08/24/23 1055  BP: 128/84  Pulse: 81  Temp: 97.8 F (36.6 C)  TempSrc: Temporal  SpO2: 97%   Weight: 204 lb 9.6 oz (92.8 kg)  Height: 5\' 8"  (1.727 m)   Body mass index is 31.11 kg/m. Physical Exam Constitutional:      Appearance: She is obese.  HENT:     Head: Normocephalic and atraumatic.     Nose: Nose normal.     Mouth/Throat:     Mouth: Mucous membranes are moist.  Eyes:     Conjunctiva/sclera: Conjunctivae normal.  Cardiovascular:     Rate and Rhythm: Normal rate and regular rhythm.  Pulmonary:     Effort: Pulmonary effort is normal.     Breath sounds: Rhonchi and rales present.  Abdominal:     General: Bowel sounds are normal.     Palpations: Abdomen is soft.  Musculoskeletal:        General: Normal range of motion.     Cervical back: Normal range of  motion.  Skin:    General: Skin is warm and dry.  Neurological:     General: No focal deficit present.     Mental Status: She is alert and oriented to person, place, and time.  Psychiatric:        Mood and Affect: Mood normal.        Behavior: Behavior normal.        Thought Content: Thought content normal.        Judgment: Judgment normal.     Labs reviewed: Basic Metabolic Panel: Recent Labs    08/31/22 1206 10/20/22 0849 05/11/23 1131 05/19/23 0955  NA 137 141 140 136  K 4.3 4.9 4.9 4.4  CL 100 103 103 99  CO2 27 28 29 29   GLUCOSE 111* 119* 108 146*  BUN 30* 24 17 22   CREATININE 1.82* 1.56* 1.39* 1.48*  CALCIUM 9.6 9.8 9.7 9.4  MG 2.0  --   --   --    Liver Function Tests: Recent Labs    08/31/22 1206 05/11/23 1131  AST 11* 9*  ALT 12 6  ALKPHOS 71  --   BILITOT 0.4 0.4  PROT 7.7 7.4  ALBUMIN 4.1  --    No results for input(s): "LIPASE", "AMYLASE" in the last 8760 hours. No results for input(s): "AMMONIA" in the last 8760 hours. CBC: Recent Labs    05/11/23 1131 05/17/23 0959 05/19/23 0955 05/30/23 1551  WBC 8.4 15.4* 12.1* 11.8*  NEUTROABS 5,897 11,827*  --  9,263*  HGB 12.5 12.9 12.1 12.1  HCT 37.2 38.2 36.1 36.6  MCV 90.5 89.9 91.4 91.3  PLT 289 282 237 262    Lipid Panel: Recent Labs    10/20/22 0849  CHOL 167  HDL 64  LDLCALC 81  TRIG 128  CHOLHDL 2.6   Lab Results  Component Value Date   HGBA1C 6.6 (H) 10/20/2022    Procedures since last visit: No results found.  Assessment/Plan  1. Acute bronchitis with COPD (HCC) (Primary) -  COPD exacerbation with bronchitis, bilateral wheezing, clear phlegm. Aggravated by illness. Negative COVID test. History of smoking. - Continue Breztri  inhaler, two puffs twice daily. - Use albuterol  inhaler as needed for dyspnea and wheezing. - Prescribe prednisone  20 mg BID for 3 days, then 20 mg daily for 3 days. - Prescribe doxycycline  for 10 days. - Advise smoking cessation. - Negative COVID test. - Prescribe doxycycline  for 10 days.. - POC COVID-19 - predniSONE  (DELTASONE ) 20 MG tablet; Take 1 tablet (20 mg total) by mouth 2 (two) times daily for 3 days, THEN 1 tablet (20 mg total) daily with breakfast for 3 days, THEN 1 tablet (20 mg total) daily with breakfast for 3 days.  Dispense: 12 tablet; Refill: 0 - doxycycline  (VIBRA -TABS) 100 MG tablet; Take 1 tablet (100 mg total) by mouth 2 (two) times daily for 10 days.  Dispense: 20 tablet; Refill: 0 - CBC with Differential/Platelets - Complete Metabolic Panel with eGFR  2. Type 2 diabetes mellitus with stage 3b chronic kidney disease, without long-term current use of insulin (HCC) -  stopped taking Metformin -  Diabetes mellitus with last A1c of 6.6% ten months ago. No recent glucose monitoring. - Check A1c today. - Evaluate kidney function before restarting metformin. - Hemoglobin A1C  3. Gastroesophageal reflux disease without esophagitis -  GERD managed with Omeprazole  40 mg daily - Continue current acid reflux medication regimen.  4. Hyperlipidemia, unspecified hyperlipidemia type -  stopped taking atorvastatin - Lipid panel  Labs/tests ordered:   CBC, CMP, A1C, lipid panel, POC COVID-19   No follow-ups on file.  Brodrick Curran  Medina-Vargas, NP

## 2023-08-25 LAB — COMPLETE METABOLIC PANEL WITHOUT GFR
AG Ratio: 1.2 (calc) (ref 1.0–2.5)
ALT: 8 U/L (ref 6–29)
AST: 12 U/L (ref 10–35)
Albumin: 4 g/dL (ref 3.6–5.1)
Alkaline phosphatase (APISO): 85 U/L (ref 37–153)
BUN/Creatinine Ratio: 13 (calc) (ref 6–22)
BUN: 18 mg/dL (ref 7–25)
CO2: 26 mmol/L (ref 20–32)
Calcium: 9.5 mg/dL (ref 8.6–10.4)
Chloride: 104 mmol/L (ref 98–110)
Creat: 1.43 mg/dL — ABNORMAL HIGH (ref 0.50–1.05)
Globulin: 3.4 g/dL (ref 1.9–3.7)
Glucose, Bld: 125 mg/dL — ABNORMAL HIGH (ref 65–99)
Potassium: 4.2 mmol/L (ref 3.5–5.3)
Sodium: 141 mmol/L (ref 135–146)
Total Bilirubin: 0.2 mg/dL (ref 0.2–1.2)
Total Protein: 7.4 g/dL (ref 6.1–8.1)

## 2023-08-25 LAB — CBC WITH DIFFERENTIAL/PLATELET
Absolute Lymphocytes: 1343 {cells}/uL (ref 850–3900)
Absolute Monocytes: 519 {cells}/uL (ref 200–950)
Basophils Absolute: 17 {cells}/uL (ref 0–200)
Basophils Relative: 0.2 %
Eosinophils Absolute: 51 {cells}/uL (ref 15–500)
Eosinophils Relative: 0.6 %
HCT: 37.5 % (ref 35.0–45.0)
Hemoglobin: 12.4 g/dL (ref 11.7–15.5)
MCH: 29 pg (ref 27.0–33.0)
MCHC: 33.1 g/dL (ref 32.0–36.0)
MCV: 87.6 fL (ref 80.0–100.0)
MPV: 10.3 fL (ref 7.5–12.5)
Monocytes Relative: 6.1 %
Neutro Abs: 6571 {cells}/uL (ref 1500–7800)
Neutrophils Relative %: 77.3 %
Platelets: 271 10*3/uL (ref 140–400)
RBC: 4.28 10*6/uL (ref 3.80–5.10)
RDW: 15.6 % — ABNORMAL HIGH (ref 11.0–15.0)
Total Lymphocyte: 15.8 %
WBC: 8.5 10*3/uL (ref 3.8–10.8)

## 2023-08-25 LAB — LIPID PANEL
Cholesterol: 256 mg/dL — ABNORMAL HIGH (ref <200)
HDL: 54 mg/dL (ref >=50)
LDL Cholesterol (Calc): 160 mg/dL — ABNORMAL HIGH
Non-HDL Cholesterol (Calc): 202 mg/dL — ABNORMAL HIGH (ref <130)
Total CHOL/HDL Ratio: 4.7 (calc) (ref <5.0)
Triglycerides: 257 mg/dL — ABNORMAL HIGH (ref <150)

## 2023-08-25 LAB — HEMOGLOBIN A1C
Hgb A1c MFr Bld: 7.1 % — ABNORMAL HIGH (ref <5.7)
Mean Plasma Glucose: 157 mg/dL
eAG (mmol/L): 8.7 mmol/L

## 2023-08-26 ENCOUNTER — Other Ambulatory Visit: Payer: Self-pay | Admitting: Adult Health

## 2023-08-26 MED ORDER — ATORVASTATIN CALCIUM 20 MG PO TABS: 20.0000 mg | ORAL_TABLET | Freq: Every evening | ORAL | 1 refills | Status: DC

## 2023-08-26 NOTE — Progress Notes (Signed)
-   Cholesterol 256, up from 167 -Triglycerides 257, up from 128, please start taking atorvastatin 20 mg daily, prescription was sent to your pharmacy -   A1c 7.1, up from 6.6, continue taking metformin - No anemia, WBC not elevated -   Kidney function ranging at chronic kidney disease stage IIIb, will need to have a good control of blood sugar, exercise at least 150 minutes/week, low-carb, low-fat diet such as Mediterranean diet

## 2023-08-29 ENCOUNTER — Telehealth: Payer: Self-pay

## 2023-08-29 ENCOUNTER — Other Ambulatory Visit: Payer: Self-pay | Admitting: Adult Health

## 2023-08-29 DIAGNOSIS — E782 Mixed hyperlipidemia: Secondary | ICD-10-CM

## 2023-08-29 DIAGNOSIS — N1832 Chronic kidney disease, stage 3b: Secondary | ICD-10-CM

## 2023-08-29 MED ORDER — METFORMIN HCL 500 MG PO TABS: 500.0000 mg | ORAL_TABLET | Freq: Two times a day (BID) | ORAL | 1 refills | Status: DC

## 2023-08-29 MED ORDER — ATORVASTATIN CALCIUM 20 MG PO TABS: 20.0000 mg | ORAL_TABLET | Freq: Every evening | ORAL | 1 refills | Status: AC

## 2023-08-29 NOTE — Telephone Encounter (Signed)
 Pt wanted PCP to know that she had not been taking Atorvastatin and Metformin and that is why her levels are elevated. Pt concerned about labs and renal issues and would like a call back . Please send in a new Rx for Metformin so pt restart. Please call her back about lab work.

## 2023-08-29 NOTE — Telephone Encounter (Deleted)
 Copied from CRM [id number]. Topic: Clinical - Lab/Test Results >> Aug 29, 2023 10:03 AM Brynn Caras wrote: Reason for CRM: PT received missed call to discuss her lab results from 08/26/2023. Relayed the results verbatim as requested, PT has additional questions regarding her increased levels.

## 2023-08-29 NOTE — Addendum Note (Signed)
 Addended by: Rexie Catena A on: 08/29/2023 10:32 AM   Modules accepted: Orders

## 2023-08-29 NOTE — Telephone Encounter (Signed)
 Atorvastatin and Metformin eRx sent to pharmacy. FYI.

## 2023-08-29 NOTE — Telephone Encounter (Addendum)
 Medina-Vargas, Monina C, NP to [facility]      08/26/23  9:29 AM Result Note - Cholesterol 256, up from 167 -Triglycerides 257, up from 128, please start taking atorvastatin 20 mg daily, prescription was sent to your pharmacy -   A1c 7.1, up from 6.6, continue taking metformin - No anemia, WBC not elevated -   Kidney function ranging at chronic kidney disease stage IIIb, will need to have a good control of blood sugar, exercise at least 150 minutes/week, low-carb, low-fat diet such as Mediterranean diet   Tried calling patient back, LMOM to return call.   Pended Rx's and sent to Monina for approval and to Advise.

## 2023-08-30 NOTE — Telephone Encounter (Signed)
 Tried to call patient to confirm that she received her Rx's [facility] to return call for confirmation.

## 2023-08-31 ENCOUNTER — Other Ambulatory Visit: Payer: Self-pay | Admitting: Adult Health

## 2023-08-31 DIAGNOSIS — K219 Gastro-esophageal reflux disease without esophagitis: Secondary | ICD-10-CM

## 2023-08-31 MED ORDER — OMEPRAZOLE 40 MG PO CPDR: 40.0000 mg | DELAYED_RELEASE_CAPSULE | Freq: Every day | ORAL | 3 refills | Status: AC

## 2023-08-31 NOTE — Telephone Encounter (Signed)
 Copied from CRM [id number]. Topic: Clinical - Medication Refill >> Aug 31, 2023  8:44 AM Sheena Arias wrote: Medication: omeprazole  (PRILOSEC) 40 MG capsule  Has the patient contacted their pharmacy? Yes   This is the patient's preferred pharmacy:   [id number] [name], [place] - [address] AT [facility] [address] [facility] [address] & [facility] [address] [name] [address] Phone: [phone] Fax: [phone]  Is this the correct pharmacy for this prescription? Yes    Has the prescription been filled recently? No  Is the patient out of the medication? Yes  Has the patient been seen for an appointment in the last year OR does the patient have an upcoming appointment? Yes  Can we respond through MyChart? Yes  Agent: Please be advised that Rx refills may take up to 3 business days. We ask that you follow-up with your pharmacy.

## 2023-10-05 ENCOUNTER — Other Ambulatory Visit: Payer: Self-pay | Admitting: Adult Health

## 2023-10-05 DIAGNOSIS — J432 Centrilobular emphysema: Secondary | ICD-10-CM

## 2023-11-08 ENCOUNTER — Other Ambulatory Visit: Payer: Self-pay | Admitting: Adult Health

## 2023-11-08 DIAGNOSIS — J432 Centrilobular emphysema: Secondary | ICD-10-CM

## 2023-11-09 ENCOUNTER — Encounter: Payer: Self-pay | Admitting: Orthopedic Surgery

## 2023-11-09 ENCOUNTER — Ambulatory Visit: Admitting: Orthopedic Surgery

## 2023-11-09 ENCOUNTER — Ambulatory Visit: Payer: Self-pay | Admitting: *Deleted

## 2023-11-09 VITALS — BP 128/60 | HR 97 | Temp 97.5°F | Resp 16 | Ht 68.0 in | Wt 208.4 lb

## 2023-11-09 DIAGNOSIS — R600 Localized edema: Secondary | ICD-10-CM

## 2023-11-09 DIAGNOSIS — J441 Chronic obstructive pulmonary disease with (acute) exacerbation: Secondary | ICD-10-CM

## 2023-11-09 DIAGNOSIS — F17200 Nicotine dependence, unspecified, uncomplicated: Secondary | ICD-10-CM

## 2023-11-09 MED ORDER — PREDNISONE 20 MG PO TABS
40.0000 mg | ORAL_TABLET | Freq: Every day | ORAL | 0 refills | Status: AC
Start: 2023-11-09 — End: 2023-11-14

## 2023-11-09 MED ORDER — AMOXICILLIN-POT CLAVULANATE 875-125 MG PO TABS: 1.0000 | ORAL_TABLET | Freq: Two times a day (BID) | ORAL | 0 refills | Status: AC

## 2023-11-09 NOTE — Progress Notes (Signed)
 Careteam: Patient Care Team: Medina-Vargas, Jereld BROCKS, NP as PCP - General (Internal Medicine) Tamela Maudlin, MD as Referring Physician (Gastroenterology) Jeronimo Norleen HERO, MD as Referring Physician (Cardiology) Birdena Lin, MD as Referring Physician (Pulmonary Disease)  Seen by: Greig Cluster, AGNP-C  PLACE OF SERVICE:  [facility]  Advanced Directive information Does Patient Have a Medical Advance Directive?: No, Would patient like information on creating a medical advance directive?: No - Patient declined  Allergies  Allergen Reactions   Iohexol      Code: HIVES, Desc: SINGULAR HIVE LT CHEST S/P 13 HR PREP OF PREDNISONE  & BENADRYL//A.C.     Chief Complaint  Patient presents with   Shortness of Breath    Patient complains of SOB, coughing, wheezing, and feet swelling.      HPI: [name] seen today for acute visit due to shortness of breath.   Discussed the use of AI scribe software for clinical note transcription with the patient, who gave verbal consent to proceed.  History of Present Illness    She has been experiencing worsening shortness of breath, cough, and wheezing since Monday, accompanied by nasal congestion and headache. She stayed home from work on Monday and returned on Tuesday, but was sent home due to her appearance of not feeling well. No fever is reported. No expectoration of phlegm with coughing. She is taking albuterol  and Breztri  as prescribed.   She has a history of COPD exacerbations, with a recent emergency room visit in January for this issue. She has been smoking since her thirties and currently smokes about a pack a day. She has difficulty quitting smoking despite previous attempts.  She reports swelling in her feet for the past two weeks, with new onset numbness and tingling in her toes, described as similar to the sensation of Novocaine wearing off. There is a knot on one foot, which is worse than the other, but both feet are  swollen. No swelling in her legs is noted, only in her feet.   Her diet consists mainly of fast food and snacks, and she lives alone. She has a history of diabetes and is on medication for it. She has been experiencing low energy and lack of appetite recently, and she has been sleeping more than usual.      Review of Systems:  Review of Systems  Constitutional:  Negative for fever.  HENT:  Positive for congestion. Negative for sore throat.   Eyes:  Negative for blurred vision and double vision.  Respiratory:  Positive for cough, sputum production, shortness of breath and wheezing.   Cardiovascular:  Positive for leg swelling. Negative for chest pain.  Gastrointestinal:  Negative for nausea and vomiting.  Musculoskeletal:  Negative for myalgias.  Neurological:  Negative for dizziness and headaches.  Psychiatric/Behavioral:  Negative for depression. The patient is not nervous/anxious.     Past Medical History:  Diagnosis Date   Bronchitis    COPD (chronic obstructive pulmonary disease) (HCC)    High blood sugar    High cholesterol    Shortness of breath    Past Surgical History:  Procedure Laterality Date   COLONOSCOPY  2005   LYMPHADENECTOMY Right 1969   SHOULDER SURGERY     THYROIDECTOMY  2016   Social History:   reports that she has been smoking cigarettes. She has never used smokeless tobacco. She reports that she does not currently use alcohol. She reports that she does not use drugs.  Family  History  Problem Relation Age of Onset   Colon cancer Mother    Heart disease Father    Breast cancer Sister    Liver disease Sister    Liver disease Brother    Diabetes Brother    Glaucoma Brother     Medications: Patient's Medications  New Prescriptions   No medications on file  Previous Medications   ALBUTEROL  (VENTOLIN  HFA) 108 (90 BASE) MCG/ACT INHALER    INHALE 2 PUFFS INTO THE LUNGS FOUR TIMES DAILY AS NEEDED FOR WHEEZING OR SHORTNESS OF BREATH   ATORVASTATIN   (LIPITOR) 20 MG TABLET    Take 1 tablet (20 mg total) by mouth every evening.   BENZONATATE  (TESSALON ) 200 MG CAPSULE    Take 1 capsule (200 mg total) by mouth 2 (two) times daily as needed for cough.   BREZTRI  AEROSPHERE 160-9-4.8 MCG/ACT AERO    INHALE 2 PUFFS INTO THE LUNGS TWICE DAILY   FLUTICASONE  (FLONASE ) 50 MCG/ACT NASAL SPRAY    Place 2 sprays into both nostrils daily.   LORATADINE  (CLARITIN ) 10 MG TABLET    Take 1 tablet (10 mg total) by mouth daily.   METFORMIN  (GLUCOPHAGE ) 500 MG TABLET    Take 1 tablet (500 mg total) by mouth 2 (two) times daily.   OMEPRAZOLE  (PRILOSEC) 40 MG CAPSULE    Take 1 capsule (40 mg total) by mouth daily.  Modified Medications   No medications on file  Discontinued Medications   No medications on file    Physical Exam:  Vitals:   11/09/23 1332  BP: (!) 140/62  Pulse: 97  Resp: 16  Temp: (!) 97.5 F (36.4 C)  SpO2: 94%  Weight: 208 lb 6.4 oz (94.5 kg)  Height: 5' 8 (1.727 m)   Body mass index is 31.69 kg/m. Wt Readings from Last 3 Encounters:  11/09/23 208 lb 6.4 oz (94.5 kg)  08/24/23 204 lb 9.6 oz (92.8 kg)  05/17/23 202 lb 6.4 oz (91.8 kg)    Physical Exam Vitals reviewed.  Constitutional:      General: She is not in acute distress.    Appearance: She is not ill-appearing.     Comments: Cigarette odor  HENT:     Head: Normocephalic.  Eyes:     General:        Right eye: No discharge.        Left eye: No discharge.  Cardiovascular:     Rate and Rhythm: Normal rate and regular rhythm. No extrasystoles are present.    Pulses: Normal pulses.     Heart sounds: Normal heart sounds.  Pulmonary:     Effort: Pulmonary effort is normal. No respiratory distress.     Breath sounds: Wheezing and rhonchi present. No rales.  Abdominal:     General: Bowel sounds are normal.     Palpations: Abdomen is soft.  Musculoskeletal:     Cervical back: Neck supple.     Right lower leg: Edema present.     Left lower leg: Edema present.      Comments: Non pitting  Skin:    General: Skin is warm.     Capillary Refill: Capillary refill takes less than 2 seconds.  Neurological:     General: No focal deficit present.     Mental Status: She is alert and oriented to person, place, and time.  Psychiatric:        Mood and Affect: Mood normal.     Labs reviewed: Basic Metabolic Panel: Recent Labs  05/11/23 1131 05/19/23 0955 08/24/23 1142  NA 140 136 141  K 4.9 4.4 4.2  CL 103 99 104  CO2 29 29 26   GLUCOSE 108 146* 125*  BUN 17 22 18   CREATININE 1.39* 1.48* 1.43*  CALCIUM  9.7 9.4 9.5   Liver Function Tests: Recent Labs    05/11/23 1131 08/24/23 1142  AST 9* 12  ALT 6 8  BILITOT 0.4 0.2  PROT 7.4 7.4   No results for input(s): LIPASE, AMYLASE in the last 8760 hours. No results for input(s): AMMONIA in the last 8760 hours. CBC: Recent Labs    05/17/23 0959 05/19/23 0955 05/30/23 1551 08/24/23 1142  WBC 15.4* 12.1* 11.8* 8.5  NEUTROABS 11,827*  --  9,263* 6,571  HGB 12.9 12.1 12.1 12.4  HCT 38.2 36.1 36.6 37.5  MCV 89.9 91.4 91.3 87.6  PLT 282 237 262 271   Lipid Panel: Recent Labs    08/24/23 1142  CHOL 256*  HDL 54  LDLCALC 160*  TRIG 257*  CHOLHDL 4.7   TSH: No results for input(s): TSH in the last 8760 hours. A1C: Lab Results  Component Value Date   HGBA1C 7.1 (H) 08/24/2023     Assessment/Plan 1. COPD with acute exacerbation (HCC) (Primary) - increased wheezing, cough and shortness of breath x 3 days - exp wheezing, dry cough, rhonchi throughout lung fields on exam - continues to smoke while sick - suspect exacerbation> 3 episode in 7 months  - start prednisone  and Augmentin   - cont albuterol  and Breztri  - predniSONE  (DELTASONE ) 20 MG tablet; Take 2 tablets (40 mg total) by mouth daily with breakfast for 5 days.  Dispense: 10 tablet; Refill: 0 - amoxicillin -clavulanate (AUGMENTIN ) 875-125 MG tablet; Take 1 tablet by mouth 2 (two) times daily for 7 days.  Dispense: 14  tablet; Refill: 0 - Ambulatory referral to Pulmonology  2. 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 Lower leg edema - non pitting on exam - sedentary job - eating fast food/processed foods  - advised leg elevation 1-2 hours daily - recommend low sodium diet< 2000 mg daily - if no improvement contact PCP  Total time: 32 minutes. Greater than 50% of total time spent doing patient education regarding shortness of breath, wheezing, smoking cessation and lower leg edema including symptom/medication management.     Next appt: Visit date not found  Margy Sumler Gil BODILY  [facility] [phone]

## 2023-11-09 NOTE — Patient Instructions (Signed)
 Prescription for prednisone  ( take in AM) and antibiotic sent to pharmacy  Referral made to pulmonary specialist> [facility]> please schedule  Please quit smoking> start to cut down by 1 each week to eventually quit> we can also consider Chantix for smoking cessation  Elevate legs 2-4 hours daily to help with swelling  Reduce sodium in diet< 2000 mg daily   May try yellow mustard at night for neuropathy

## 2023-11-09 NOTE — Telephone Encounter (Signed)
 FYI Only or Action Required?: Action required by provider: request for appointment.  Patient was last seen in primary care on 08/24/2023 by Medina-Vargas, Jereld BROCKS, NP.  Called Nurse Triage reporting Cough.  Symptoms began yesterday.  Interventions attempted: Rest, hydration, or home remedies.  Symptoms are: gradually worsening.  Triage Disposition: See HCP Within 4 Hours (Or PCP Triage)  Patient/caregiver understands and will follow disposition?: Yes            Copied from CRM [id number]. Topic: Clinical - Red Word Triage >> Nov 09, 2023  8:11 AM Merlynn A wrote: Red Word that prompted transfer to Nurse Triage: Swelling in feet (top of feet pain) knot on top of left foot/Numbness/Not feeling well Reason for Disposition  [1] MILD difficulty breathing (e.g., minimal/no SOB at rest, SOB with walking, pulse < 100) AND [2] still present when not coughing  Answer Assessment - Initial Assessment Questions Appt scheduled today none available with PCP. Recommended if sx worsen to to ED.       1. ONSET: When did the cough begin?      Monday  2. SEVERITY: How bad is the cough today?      Coughing with white sputum 3. SPUTUM: Describe the color of your sputum (e.g., none, dry cough; clear, white, yellow, green)     White  4. HEMOPTYSIS: Are you coughing up any blood? If Yes, ask: How much? (e.g., flecks, streaks, tablespoons, etc.)     na 5. DIFFICULTY BREATHING: Are you having difficulty breathing? If Yes, ask: How bad is it? (e.g., mild, moderate, severe)      SOB with exertion, wheezing  6. FEVER: Do you have a fever? If Yes, ask: What is your temperature, how was it measured, and when did it start?     no 7. CARDIAC HISTORY: Do you have any history of heart disease? (e.g., heart attack, congestive heart failure)      na 8. LUNG HISTORY: Do you have any history of lung disease?  (e.g., pulmonary embolus, asthma, emphysema)     Hx Asthma, possible COPD   9. PE RISK FACTORS: Do you have a history of blood clots? (or: recent major surgery, recent prolonged travel, bedridden)     na 10. OTHER SYMPTOMS: Do you have any other symptoms? (e.g., runny nose, wheezing, chest pain)       Cough congestion, SOB with exertion, wheezing chest discomfort worsening with coughing episode, reports vomiting every am . No appetite. Reports bilateral feet swelling pain, knot on top of left foot. Right foot swelling > left. Numbness in toes 11. PREGNANCY: Is there any chance you are pregnant? When was your last menstrual period?       na 12. TRAVEL: Have you traveled out of the country in the last month? (e.g., travel history, exposures)       na  Protocols used: Cough - Acute Productive-A-AH

## 2023-11-09 NOTE — Telephone Encounter (Signed)
 Merlynn Jon RAMAN, RN to [facility]  (Selected Message)     11/09/23  8:33 AM FYI- scheduled appt today

## 2023-11-09 NOTE — Telephone Encounter (Signed)
 Message routed to Greig Cluster, NP as RICK.

## 2023-11-24 ENCOUNTER — Ambulatory Visit: Admitting: Adult Health

## 2023-11-24 ENCOUNTER — Encounter: Payer: Self-pay | Admitting: Adult Health

## 2023-11-24 VITALS — BP 140/80 | HR 82 | Temp 98.2°F | Ht 68.0 in | Wt 208.4 lb

## 2023-11-24 DIAGNOSIS — N1832 Chronic kidney disease, stage 3b: Secondary | ICD-10-CM

## 2023-11-24 DIAGNOSIS — Z13820 Encounter for screening for osteoporosis: Secondary | ICD-10-CM

## 2023-11-24 DIAGNOSIS — E1122 Type 2 diabetes mellitus with diabetic chronic kidney disease: Secondary | ICD-10-CM | POA: Diagnosis not present

## 2023-11-24 DIAGNOSIS — F17200 Nicotine dependence, unspecified, uncomplicated: Secondary | ICD-10-CM

## 2023-11-24 DIAGNOSIS — J449 Chronic obstructive pulmonary disease, unspecified: Secondary | ICD-10-CM

## 2023-11-24 DIAGNOSIS — Z124 Encounter for screening for malignant neoplasm of cervix: Secondary | ICD-10-CM

## 2023-11-24 DIAGNOSIS — Z1231 Encounter for screening mammogram for malignant neoplasm of breast: Secondary | ICD-10-CM

## 2023-11-24 DIAGNOSIS — F321 Major depressive disorder, single episode, moderate: Secondary | ICD-10-CM

## 2023-11-24 DIAGNOSIS — E782 Mixed hyperlipidemia: Secondary | ICD-10-CM

## 2023-11-24 DIAGNOSIS — R600 Localized edema: Secondary | ICD-10-CM | POA: Diagnosis not present

## 2023-11-24 DIAGNOSIS — Z1211 Encounter for screening for malignant neoplasm of colon: Secondary | ICD-10-CM

## 2023-11-24 DIAGNOSIS — Z1212 Encounter for screening for malignant neoplasm of rectum: Secondary | ICD-10-CM

## 2023-11-24 MED ORDER — FUROSEMIDE 20 MG PO TABS: 20.0000 mg | ORAL_TABLET | Freq: Every day | ORAL | 0 refills | Status: DC

## 2023-11-24 NOTE — Progress Notes (Signed)
 [facility] clinic  Provider:  Jereld Serum DNP  Code Status:  Full Code  Goals of Care:     11/09/2023    1:41 PM  Advanced Directives  Does Patient Have a Medical Advance Directive? No  Would patient like information on creating a medical advance directive? No - Patient declined     Chief Complaint  Patient presents with   Follow-up    Discussed the use of AI scribe software for clinical note transcription with the patient, who gave verbal consent to proceed.  HPI: [name] seen today for follow up of chronic medical issues.  She has experienced bilateral lower extremity swelling for the past four weeks, described as hard, with associated numbness in the toes and calves. The swelling has impacted her ability to wear boots, requiring her to cut one off due to the severity. She finds it challenging to elevate her feet at work, which may contribute to the persistence of symptoms.  She experiences shortness of breath, particularly during conversation, which has been noted by her employer. She has a history of bronchitis and is scheduled to see a pulmonologist in September. She uses Breztri  inhaler two puffs twice a day and albuterol  as needed for shortness of breath and wheezing.  She is under significant stress due to her employer reducing her work hours because of health concerns, affecting her financial stability. She reports symptoms of depression, including feeling down several days a week, poor appetite, and trouble sleeping nearly every day. She has a history of taking antidepressants for acid reflux but is not currently on any medication for depression.  She has diabetes with an A1c of 7.1 three months ago and takes metformin  500 mg twice a day. She consumes a lot of sodas and iced tea, which she acknowledges may affect her blood sugar levels.  She takes atorvastatin  20 mg daily for hypercholesterolemia. 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    Past Medical History:  Diagnosis Date   Bronchitis    COPD (chronic obstructive pulmonary disease) (HCC)    High blood sugar    High cholesterol    Shortness of breath     Past Surgical History:  Procedure Laterality Date   COLONOSCOPY  2005   LYMPHADENECTOMY Right 1969   SHOULDER SURGERY     THYROIDECTOMY  2016    Allergies  Allergen Reactions   Iohexol      Code: HIVES, Desc: SINGULAR HIVE LT CHEST S/P 13 HR PREP OF PREDNISONE  & BENADRYL//A.C.     Outpatient Encounter Medications as of 11/24/2023  Medication Sig   albuterol  (VENTOLIN  HFA) 108 (90 Base) MCG/ACT inhaler INHALE 2 PUFFS INTO THE LUNGS FOUR TIMES DAILY AS NEEDED FOR WHEEZING OR SHORTNESS OF BREATH   atorvastatin  (LIPITOR) 20 MG tablet Take 1 tablet (20 mg total) by mouth every evening.   BREZTRI  AEROSPHERE 160-9-4.8 MCG/ACT AERO INHALE 2 PUFFS INTO THE LUNGS TWICE DAILY   furosemide  (LASIX ) 20 MG tablet Take 1 tablet (20 mg total) by mouth daily for 14 days.   loratadine  (CLARITIN ) 10 MG tablet Take 1 tablet (10 mg total) by mouth daily.   metFORMIN  (GLUCOPHAGE ) 500 MG tablet Take 1 tablet (500 mg total) by mouth 2 (two) times daily.   omeprazole  (PRILOSEC) 40 MG capsule Take 1 capsule (40 mg total) by mouth daily.   fluticasone  (FLONASE ) 50 MCG/ACT nasal spray Place 2  sprays into both nostrils daily. (Patient not taking: Reported on 11/24/2023)   No facility-administered encounter medications on file as of 11/24/2023.    Review of Systems:  Review of Systems  Constitutional:  Negative for appetite change, chills, fatigue and fever.  HENT:  Negative for congestion, hearing loss, rhinorrhea and sore throat.   Eyes: Negative.   Respiratory:  Positive for shortness of breath. Negative for cough and wheezing.   Cardiovascular:  Positive for leg swelling. Negative for chest pain and palpitations.  Gastrointestinal:  Negative for abdominal pain,  constipation, diarrhea, nausea and vomiting.  Genitourinary:  Negative for dysuria.  Musculoskeletal:  Negative for arthralgias, back pain and myalgias.  Skin:  Negative for color change, rash and wound.  Neurological:  Negative for dizziness, weakness and headaches.  Psychiatric/Behavioral:  Negative for behavioral problems. The patient is not nervous/anxious.     Health Maintenance  Topic Date Due   Pneumococcal Vaccine: 50+ Years (1 of 2 - PCV) Never done   Cervical Cancer Screening (HPV/Pap Cotest)  Never done   Colonoscopy  Never done   Zoster Vaccines- Shingrix (1 of 2) Never done   DTaP/Tdap/Td (2 - Tdap) 03/26/2014   COVID-19 Vaccine (3 - 2024-25 season) 12/26/2022   DEXA SCAN  Never done   INFLUENZA VACCINE  11/25/2023   MAMMOGRAM  03/31/2024   Diabetic kidney evaluation - eGFR measurement  08/23/2024   Diabetic kidney evaluation - Urine ACR  11/23/2024   Hepatitis C Screening  Completed   HIV Screening  Completed   Hepatitis B Vaccines  Aged Out   HPV VACCINES  Aged Out   Meningococcal B Vaccine  Aged Out    Physical Exam: Vitals:   11/24/23 1408  BP: (!) 140/80  Pulse: 82  Temp: 98.2 F (36.8 C)  SpO2: 98%  Weight: 208 lb 6.4 oz (94.5 kg)  Height: 5' 8 (1.727 m)   Body mass index is 31.69 kg/m. Physical Exam Constitutional:      General: She is not in acute distress.    Appearance: She is obese.  HENT:     Head: Normocephalic and atraumatic.     Nose: Nose normal.     Mouth/Throat:     Mouth: Mucous membranes are moist.  Eyes:     Conjunctiva/sclera: Conjunctivae normal.  Cardiovascular:     Rate and Rhythm: Normal rate and regular rhythm.  Pulmonary:     Effort: Pulmonary effort is normal.     Breath sounds: Normal breath sounds.  Abdominal:     General: Bowel sounds are normal.     Palpations: Abdomen is soft.  Musculoskeletal:        General: Swelling present. Normal range of motion.     Cervical back: Normal range of motion.     Right  lower leg: Edema present.     Left lower leg: Edema present.     Comments: BLE 2+edema  Skin:    General: Skin is warm and dry.  Neurological:     General: No focal deficit present.     Mental Status: She is alert and oriented to person, place, and time.  Psychiatric:        Mood and Affect: Mood normal.        Behavior: Behavior normal.        Thought Content: Thought content normal.        Judgment: Judgment normal.     Labs reviewed: Basic Metabolic Panel: Recent Labs    05/11/23  1131 05/19/23 0955 08/24/23 1142  NA 140 136 141  K 4.9 4.4 4.2  CL 103 99 104  CO2 29 29 26   GLUCOSE 108 146* 125*  BUN 17 22 18   CREATININE 1.39* 1.48* 1.43*  CALCIUM  9.7 9.4 9.5   Liver Function Tests: Recent Labs    05/11/23 1131 08/24/23 1142  AST 9* 12  ALT 6 8  BILITOT 0.4 0.2  PROT 7.4 7.4   No results for input(s): LIPASE, AMYLASE in the last 8760 hours. No results for input(s): AMMONIA in the last 8760 hours. CBC: Recent Labs    05/17/23 0959 05/19/23 0955 05/30/23 1551 08/24/23 1142  WBC 15.4* 12.1* 11.8* 8.5  NEUTROABS 11,827*  --  9,263* 6,571  HGB 12.9 12.1 12.1 12.4  HCT 38.2 36.1 36.6 37.5  MCV 89.9 91.4 91.3 87.6  PLT 282 237 262 271   Lipid Panel: Recent Labs    08/24/23 1142  CHOL 256*  HDL 54  LDLCALC 160*  TRIG 257*  CHOLHDL 4.7   Lab Results  Component Value Date   HGBA1C 7.1 (H) 08/24/2023    Procedures since last visit: No results found.  Assessment/Plan  1. Bilateral lower extremity edema (Primary) -  Bilateral edema for four weeks, likely due to venous and renal insufficiency. Diabetes and CKD may contribute. - Prescribe Lasix  20 mg daily for two weeks. - Advise leg elevation above heart level at night. - Recommend wearing compression socks during the day, and off at night - furosemide  (LASIX ) 20 MG tablet; Take 1 tablet (20 mg total) by mouth daily for 14 days.  Dispense: 14 tablet; Refill: 0  2. Chronic obstructive  pulmonary disease, unspecified COPD type (HCC) -  with shortness of breath. Under pulmonologist care. Continued inhaler therapy. - Continue Breztri  inhaler, two puffs twice a day. - Use Albuterol  as needed for shortness of breath.  3. Type 2 diabetes mellitus with stage 3b chronic kidney disease, without long-term current use of insulin (HCC) -  with A1c 7.1. Advised lifestyle modifications to manage blood sugar. - Encourage 150 minutes of exercise per week, starting with 5 minutes a day. - Advise to stop soda consumption. - Microalbumin/Creatinine Ratio, Urine  4. Mixed hyperlipidemia Lab Results  Component Value Date   CHOL 256 (H) 08/24/2023   HDL 54 08/24/2023   LDLCALC 160 (H) 08/24/2023   LDLDIRECT 160.3 03/13/2008   TRIG 257 (H) 08/24/2023   CHOLHDL 4.7 08/24/2023    -  continue Lipitor 20 mg in the evening  5. Screening for osteoporosis - DG BONE DENSITY (DXA); Future  6. Screening for cervical cancer - Ambulatory referral to Obstetrics / Gynecology  7. Screening for colorectal cancer - Cologuard  8. Chronic kidney disease, stage 3b (HCC) -  with GFR 41. Advised to avoid NSAIDs and manage blood pressure and blood sugar to prevent progression. - Order urine microalbumin test for proteinuria. -  Encourage increased water intake and reduction of soda consumption.  9. Screening mammogram for breast cancer - MM 3D SCREENING MAMMOGRAM BILATERAL BREAST  11. Moderate major depression (HCC) -  PHQ-9 score of 10 indicates moderate depression. Declined antidepressant therapy.       Labs/tests ordered: Cologuard, bone density, mammogram, urine microalbumin creatinine ratio   Return in about 2 weeks (around 12/08/2023).  Evah Rashid Medina-Vargas, NP

## 2023-11-24 NOTE — Patient Instructions (Addendum)
  Needs TDaP, Pneumonia and COVID-19 vaccine.

## 2023-11-25 LAB — MICROALBUMIN / CREATININE URINE RATIO
Creatinine, Urine: 187 mg/dL (ref 20–275)
Microalb Creat Ratio: 4 mg/g{creat} (ref <30)
Microalb, Ur: 0.8 mg/dL

## 2023-11-27 ENCOUNTER — Ambulatory Visit: Payer: Self-pay | Admitting: Adult Health

## 2023-11-27 NOTE — Progress Notes (Signed)
-     Urine microalbumin creatinine ratio within normal

## 2023-12-07 NOTE — Progress Notes (Signed)
 [facility] clinic  Provider:  Jereld Serum DNP  Code Status:  Full Code  Goals of Care:     11/09/2023    1:41 PM  Advanced Directives  Does Patient Have a Medical Advance Directive? No  Would patient like information on creating a medical advance directive? No - Patient declined     Chief Complaint  Patient presents with   Medical Management of Chronic Issues    2 week follow up    Discussed the use of AI scribe software for clinical note transcription with the patient, who gave verbal consent to proceed.  HPI: [name] seen today for a 2-week follow up of chronic medical issues.    Sheena Arias is a 63 year old female with chronic kidney disease and diabetes who presents for a two-week follow-up regarding swelling in her feet.  She has experienced improvement in the swelling of her feet and calves since her last visit, although some swelling persists, particularly after prolonged standing, such as when she ran errands last Thursday from 9 AM to 4 PM. Elevating her feet and taking the prescribed diuretic have helped reduce the swelling. She has two doses of her medication left, having possibly missed a day. Her toes are less numb, and the sensation is improving.  She has chronic kidney disease with a kidney function of 41. She is currently on a diuretic, which causes frequent urination. She reports a weight loss of six pounds since her last visit.   She has a history of diabetes and is taking metformin  500 mg twice a day. She does not regularly check her blood sugar at home but relies on lab results for monitoring.  She has COPD and uses Breztri  Aerosphere, inhaling two puffs twice a day, and albuterol  as needed, inhaling two puffs up to four times a day as needed. She no longer uses Flonase  and is considering taking loratadine  for allergies.  She has a history of mixed hyperlipidemia and takes atorvastatin  20 mg daily for cholesterol management.  She  experiences acid reflux and takes omeprazole  40 mg daily. She reports feeling sick in the mornings, a symptom she has experienced for a long time.  She has a history of a severe complication from a colonoscopy in 2005, which resulted in a perforated colon and required emergency surgery and a temporary colostomy bag. This experience has made her apprehensive about undergoing another colonoscopy.  She reports a change in her work status to part-time, which initially caused her distress, but she is now feeling more positive about it. She has been in contact with her financial advisor and feels reassured about her financial situation.  She reports a recent decrease in appetite and a regular bowel movement pattern, typically having one every morning.  She has a history of shingles, which affected her face and eyelid. She reports feeling tired and having a tickle in her throat. No depression today, although she was sad during her last visit due to a job change. She feels good today and is less depressed. No issues with vision, although she plans to schedule an eye exam.    Past Medical History:  Diagnosis Date   Bronchitis    COPD (chronic obstructive pulmonary disease) (HCC)    High blood sugar    High cholesterol    Shortness of breath     Past Surgical History:  Procedure Laterality Date   COLONOSCOPY  2005   LYMPHADENECTOMY Right 1969  SHOULDER SURGERY     THYROIDECTOMY  2016    Allergies  Allergen Reactions   Iohexol      Code: HIVES, Desc: SINGULAR HIVE LT CHEST S/P 13 HR PREP OF PREDNISONE  & BENADRYL//A.C.     Outpatient Encounter Medications as of 12/08/2023  Medication Sig   albuterol  (VENTOLIN  HFA) 108 (90 Base) MCG/ACT inhaler INHALE 2 PUFFS INTO THE LUNGS FOUR TIMES DAILY AS NEEDED FOR WHEEZING OR SHORTNESS OF BREATH   atorvastatin  (LIPITOR) 20 MG tablet Take 1 tablet (20 mg total) by mouth every evening.   BREZTRI  AEROSPHERE 160-9-4.8 MCG/ACT AERO INHALE 2 PUFFS INTO THE  LUNGS TWICE DAILY   furosemide  (LASIX ) 20 MG tablet Take 1 tablet (20 mg total) by mouth daily for 14 days.   metFORMIN  (GLUCOPHAGE ) 500 MG tablet Take 1 tablet (500 mg total) by mouth 2 (two) times daily.   omeprazole  (PRILOSEC) 40 MG capsule Take 1 capsule (40 mg total) by mouth daily.   loratadine  (CLARITIN ) 10 MG tablet Take 1 tablet (10 mg total) by mouth daily.   [DISCONTINUED] fluticasone  (FLONASE ) 50 MCG/ACT nasal spray Place 2 sprays into both nostrils daily. (Patient not taking: Reported on 11/24/2023)   [DISCONTINUED] loratadine  (CLARITIN ) 10 MG tablet Take 1 tablet (10 mg total) by mouth daily. (Patient not taking: Reported on 12/08/2023)   No facility-administered encounter medications on file as of 12/08/2023.    Review of Systems:  Review of Systems  Constitutional:  Negative for appetite change, chills, fatigue and fever.  HENT:  Negative for congestion, hearing loss, rhinorrhea and sore throat.   Eyes: Negative.   Respiratory:  Negative for cough, shortness of breath and wheezing.   Cardiovascular:  Positive for leg swelling. Negative for chest pain and palpitations.  Gastrointestinal:  Negative for abdominal pain, constipation, diarrhea, nausea and vomiting.  Genitourinary:  Negative for dysuria.  Musculoskeletal:  Negative for arthralgias, back pain and myalgias.  Skin:  Negative for color change, rash and wound.  Neurological:  Negative for dizziness, weakness and headaches.  Psychiatric/Behavioral:  Negative for behavioral problems. The patient is not nervous/anxious.     Health Maintenance  Topic Date Due   Cervical Cancer Screening (HPV/Pap Cotest)  Never done   Colonoscopy  Never done   Zoster Vaccines- Shingrix (1 of 2) Never done   DTaP/Tdap/Td (2 - Tdap) 03/26/2014   COVID-19 Vaccine (3 - 2024-25 season) 12/26/2022   DEXA SCAN  Never done   INFLUENZA VACCINE  11/25/2023   MAMMOGRAM  03/31/2024   Diabetic kidney evaluation - eGFR measurement  11/23/2024    Diabetic kidney evaluation - Urine ACR  11/23/2024   Pneumococcal Vaccine: 50+ Years  Completed   Hepatitis C Screening  Completed   HIV Screening  Completed   Hepatitis B Vaccines 19-59 Average Risk  Aged Out   HPV VACCINES  Aged Out   Meningococcal B Vaccine  Aged Out    Physical Exam: Vitals:   12/08/23 0832  BP: (!) 142/78  Pulse: 80  Resp: 12  Temp: 97.9 F (36.6 C)  SpO2: 96%  Weight: 202 lb 3.2 oz (91.7 kg)  Height: 5' 8 (1.727 m)   Body mass index is 30.74 kg/m. Physical Exam Constitutional:      Appearance: She is obese.  HENT:     Head: Normocephalic and atraumatic.     Nose: Nose normal.     Mouth/Throat:     Mouth: Mucous membranes are moist.  Eyes:     Conjunctiva/sclera: Conjunctivae  normal.  Cardiovascular:     Rate and Rhythm: Normal rate and regular rhythm.  Pulmonary:     Effort: Pulmonary effort is normal.     Breath sounds: Normal breath sounds.  Abdominal:     General: Bowel sounds are normal.     Palpations: Abdomen is soft.  Musculoskeletal:        General: Normal range of motion.     Cervical back: Normal range of motion.     Right lower leg: Edema present.     Left lower leg: Edema present.     Comments: BLE trace edema  Skin:    General: Skin is warm and dry.  Neurological:     General: No focal deficit present.     Mental Status: She is alert and oriented to person, place, and time.  Psychiatric:        Mood and Affect: Mood normal.        Behavior: Behavior normal.        Thought Content: Thought content normal.        Judgment: Judgment normal.     Labs reviewed: Basic Metabolic Panel: Recent Labs    05/11/23 1131 05/19/23 0955 08/24/23 1142  NA 140 136 141  K 4.9 4.4 4.2  CL 103 99 104  CO2 29 29 26   GLUCOSE 108 146* 125*  BUN 17 22 18   CREATININE 1.39* 1.48* 1.43*  CALCIUM  9.7 9.4 9.5   Liver Function Tests: Recent Labs    05/11/23 1131 08/24/23 1142  AST 9* 12  ALT 6 8  BILITOT 0.4 0.2  PROT 7.4 7.4    No results for input(s): LIPASE, AMYLASE in the last 8760 hours. No results for input(s): AMMONIA in the last 8760 hours. CBC: Recent Labs    05/17/23 0959 05/19/23 0955 05/30/23 1551 08/24/23 1142  WBC 15.4* 12.1* 11.8* 8.5  NEUTROABS 11,827*  --  9,263* 6,571  HGB 12.9 12.1 12.1 12.4  HCT 38.2 36.1 36.6 37.5  MCV 89.9 91.4 91.3 87.6  PLT 282 237 262 271   Lipid Panel: Recent Labs    08/24/23 1142  CHOL 256*  HDL 54  LDLCALC 160*  TRIG 257*  CHOLHDL 4.7   Lab Results  Component Value Date   HGBA1C 7.1 (H) 08/24/2023    Procedures since last visit: No results found.  Assessment/Plan  1. Bilateral lower extremity edema (Primary) -  Swelling improved with diuretics but recurs with prolonged standing. Numbness in toes improving. Concern for kidney impact with diuretics. - Complete diuretics for 14 days. - Monitor kidney function with blood work today. - Basic Metabolic Panel  2. Chronic kidney disease, stage 3b (HCC) -  Kidney function at 32. Concern for decline with diuretics. No nephrology referral currently, will consider if needed. - Monitor kidney function with blood work today. - Discuss potential nephrology referral if needed.  3. Type 2 diabetes mellitus with stage 3b chronic kidney disease, without long-term current use of insulin (HCC) -  Managed with metformin . No self-monitoring of blood glucose. Assess control with A1c. - Order A1c test today. - Hemoglobin A1C - Microalbumin/Creatinine Ratio, Urine - DG BONE DENSITY (DXA); Future  4. Chronic obstructive pulmonary disease, unspecified COPD type (HCC) -  Managed with Breztri  Aerosphere and albuterol . No wheezing reported. - Continue Breztri  Aerosphere and albuterol  as needed. - Pneumococcal conjugate vaccine 20-valent (Prevnar 20)  5. Mixed hyperlipidemia Lab Results  Component Value Date   CHOL 256 (H) 08/24/2023   HDL  54 08/24/2023   LDLCALC 160 (H) 08/24/2023   LDLDIRECT 160.3  03/13/2008   TRIG 257 (H) 08/24/2023   CHOLHDL 4.7 08/24/2023    -  Managed with atorvastatin  20 mg daily - Order cholesterol test today. - Lipid panel  6. Gastroesophageal reflux disease without esophagitis -  Managed with omeprazole  40 mg daily. Reports persistent morning sickness.  7. Seasonal allergies -  - Advise loratadine  10 mg daily for allergy symptoms.  - loratadine  (CLARITIN ) 10 MG tablet; Take 1 tablet (10 mg total) by mouth daily.  Dispense: 30 tablet; Refill: 11  8. Screening for osteoporosis - DG BONE DENSITY (DXA); Future      Labs/tests ordered:   A1C, lipid panel, BMP, bone density, urine creatinine microalbumin ratio   Return in about 3 months (around 03/09/2024).  Rochelle Nephew Medina-Vargas, NP

## 2023-12-08 ENCOUNTER — Encounter: Payer: Self-pay | Admitting: Adult Health

## 2023-12-08 ENCOUNTER — Ambulatory Visit: Admitting: Adult Health

## 2023-12-08 VITALS — BP 138/80 | HR 80 | Temp 97.9°F | Resp 12 | Ht 68.0 in | Wt 202.2 lb

## 2023-12-08 DIAGNOSIS — E1122 Type 2 diabetes mellitus with diabetic chronic kidney disease: Secondary | ICD-10-CM | POA: Diagnosis not present

## 2023-12-08 DIAGNOSIS — N1832 Chronic kidney disease, stage 3b: Secondary | ICD-10-CM

## 2023-12-08 DIAGNOSIS — K219 Gastro-esophageal reflux disease without esophagitis: Secondary | ICD-10-CM

## 2023-12-08 DIAGNOSIS — R600 Localized edema: Secondary | ICD-10-CM | POA: Diagnosis not present

## 2023-12-08 DIAGNOSIS — J449 Chronic obstructive pulmonary disease, unspecified: Secondary | ICD-10-CM

## 2023-12-08 DIAGNOSIS — E782 Mixed hyperlipidemia: Secondary | ICD-10-CM

## 2023-12-08 DIAGNOSIS — J302 Other seasonal allergic rhinitis: Secondary | ICD-10-CM

## 2023-12-08 DIAGNOSIS — Z13820 Encounter for screening for osteoporosis: Secondary | ICD-10-CM

## 2023-12-08 MED ORDER — LORATADINE 10 MG PO TABS: 10.0000 mg | ORAL_TABLET | Freq: Every day | ORAL | 11 refills | Status: AC

## 2023-12-09 ENCOUNTER — Ambulatory Visit: Payer: Self-pay | Admitting: Adult Health

## 2023-12-09 LAB — BASIC METABOLIC PANEL WITH GFR
BUN/Creatinine Ratio: 11 (calc) (ref 6–22)
BUN: 15 mg/dL (ref 7–25)
CO2: 28 mmol/L (ref 20–32)
Calcium: 9.4 mg/dL (ref 8.6–10.4)
Chloride: 102 mmol/L (ref 98–110)
Creat: 1.32 mg/dL — ABNORMAL HIGH (ref 0.50–1.05)
Glucose, Bld: 114 mg/dL (ref 65–139)
Potassium: 3.7 mmol/L (ref 3.5–5.3)
Sodium: 141 mmol/L (ref 135–146)
eGFR: 45 mL/min/1.73m2 — ABNORMAL LOW (ref >=60)

## 2023-12-09 LAB — LIPID PANEL
Cholesterol: 152 mg/dL (ref <200)
HDL: 56 mg/dL (ref >=50)
LDL Cholesterol (Calc): 72 mg/dL
Non-HDL Cholesterol (Calc): 96 mg/dL (ref <130)
Total CHOL/HDL Ratio: 2.7 (calc) (ref <5.0)
Triglycerides: 163 mg/dL — ABNORMAL HIGH (ref <150)

## 2023-12-09 LAB — MICROALBUMIN / CREATININE URINE RATIO
Creatinine, Urine: 258 mg/dL (ref 20–275)
Microalb Creat Ratio: 5 mg/g{creat} (ref <30)
Microalb, Ur: 1.3 mg/dL

## 2023-12-09 LAB — HEMOGLOBIN A1C
Hgb A1c MFr Bld: 7 % — ABNORMAL HIGH (ref <5.7)
Mean Plasma Glucose: 154 mg/dL
eAG (mmol/L): 8.5 mmol/L

## 2023-12-09 NOTE — Progress Notes (Signed)
-   Urine microalbumin creatinine ratio normal -A1c 7.0, down from 7.1, continue metformin , exercise for at least 150 minutes/week, low-carb diet -   Triglycerides 163, down from 257, continue atorvastatin  -   Cholesterol and LDL normal -   GFR 45, improved from 41, kidney function improved from CKD 3B to CKD 3 A

## 2023-12-14 ENCOUNTER — Other Ambulatory Visit: Payer: Self-pay | Admitting: Adult Health

## 2023-12-14 DIAGNOSIS — N1832 Chronic kidney disease, stage 3b: Secondary | ICD-10-CM

## 2023-12-14 DIAGNOSIS — R600 Localized edema: Secondary | ICD-10-CM

## 2023-12-16 ENCOUNTER — Ambulatory Visit
Admission: RE | Admit: 2023-12-16 | Discharge: 2023-12-16 | Disposition: A | Discharge status: Home / Self Care | Source: Ambulatory Visit | Attending: Adult Health | Admitting: Adult Health

## 2024-01-09 ENCOUNTER — Encounter: Payer: Self-pay | Admitting: Pulmonary Disease

## 2024-01-09 ENCOUNTER — Ambulatory Visit: Admitting: Pulmonary Disease

## 2024-01-09 VITALS — BP 151/82 | HR 94 | Temp 98.5°F | Ht 66.0 in | Wt 207.8 lb

## 2024-01-09 DIAGNOSIS — R0609 Other forms of dyspnea: Secondary | ICD-10-CM | POA: Diagnosis not present

## 2024-01-09 DIAGNOSIS — Z716 Tobacco abuse counseling: Secondary | ICD-10-CM

## 2024-01-09 DIAGNOSIS — F17210 Nicotine dependence, cigarettes, uncomplicated: Secondary | ICD-10-CM

## 2024-01-09 NOTE — Patient Instructions (Signed)
 Nice to meet you  I ordered pulmonary function test similar to you had in the past to further define or understand the function of your lungs  Continue Breztri  2 puffs in the morning, 2 puffs in the evening, rinse her mouth out after every use.  See if with regular use of this if your breathing improves with the course of the next 2 weeks.  Return to clinic in 4 to 8 weeks with pulmonary function test same day as follow-up with Dr. Annella.

## 2024-01-09 NOTE — Progress Notes (Unsigned)
 @Patient  ID: Sheena Arias, female    DOB: Jan 23, 1961, 63 y.o.   MRN: [id number]  Chief Complaint  Patient presents with  . Consult    copd    Referring provider: Gil Greig BRAVO, NP  HPI:   PMH:  Smoker/ Smoking History:  Maintenance:   Pt of:   01/09/2024  - Visit     Questionaires / Pulmonary Flowsheets:   ACT:      No data to display          MMRC:     No data to display          Epworth:      No data to display          Tests:   FENO:  No results found for: NITRICOXIDE  PFT:     No data to display          WALK:      No data to display          Imaging: MM 3D SCREENING MAMMOGRAM BILATERAL BREAST Result Date: 12/20/2023 CLINICAL DATA:  Screening. EXAM: DIGITAL SCREENING BILATERAL MAMMOGRAM WITH TOMOSYNTHESIS AND CAD TECHNIQUE: Bilateral screening digital craniocaudal and mediolateral oblique mammograms were obtained. Bilateral screening digital breast tomosynthesis was performed. The images were evaluated with computer-aided detection. COMPARISON:  Previous exam(s). ACR Breast Density Category b: There are scattered areas of fibroglandular density. FINDINGS: There are no findings suspicious for malignancy. IMPRESSION: No mammographic evidence of malignancy. A result letter of this screening mammogram will be mailed directly to the patient. RECOMMENDATION: Screening mammogram in one year. (Code:SM-B-01Y) BI-RADS CATEGORY  1: Negative. Electronically Signed   By: Inocente Ast M.D.   On: 12/20/2023 13:54    Lab Results:  CBC    Component Value Date/Time   WBC 8.5 08/24/2023 1142   RBC 4.28 08/24/2023 1142   HGB 12.4 08/24/2023 1142   HCT 37.5 08/24/2023 1142   PLT 271 08/24/2023 1142   MCV 87.6 08/24/2023 1142   MCH 29.0 08/24/2023 1142   MCHC 33.1 08/24/2023 1142   RDW 15.6 (H) 08/24/2023 1142   LYMPHSABS 1.6 08/31/2022 1206   MONOABS 0.5 08/31/2022 1206   EOSABS 51 08/24/2023 1142   BASOSABS 17 08/24/2023 1142     BMET    Component Value Date/Time   NA 141 12/08/2023 0915   K 3.7 12/08/2023 0915   CL 102 12/08/2023 0915   CO2 28 12/08/2023 0915   GLUCOSE 114 12/08/2023 0915   BUN 15 12/08/2023 0915   CREATININE 1.32 (H) 12/08/2023 0915   CALCIUM  9.4 12/08/2023 0915   GFRNONAA 39 (L) 05/19/2023 0955   GFRAA 76 03/13/2008 0840    BNP No results found for: BNP  ProBNP No results found for: PROBNP  Specialty Problems       Pulmonary Problems   VIRAL URI   Qualifier: Diagnosis of  By: Krystal MD, Reyes A  Replacing diagnoses that were inactivated after the 07/26/22 regulatory import      EXTRINSIC ASTHMA, UNSPECIFIED   Qualifier: Diagnosis of  By: Krystal MD, Reyes A  Replacing diagnoses that were inactivated after the 07/26/22 regulatory import      Bronchitis    Allergies  Allergen Reactions  . Iohexol      Code: HIVES, Desc: SINGULAR HIVE LT CHEST S/P 13 HR PREP OF PREDNISONE  & BENADRYL//A.C.     Immunization History  Administered Date(s) Administered  . PFIZER Comirnaty(Gray Top)Covid-19 Tri-Sucrose Vaccine 09/10/2019, 10/01/2019  . PNEUMOCOCCAL  CONJUGATE-20 12/08/2023  . Td 03/26/2004    Past Medical History:  Diagnosis Date  . Bronchitis   . COPD (chronic obstructive pulmonary disease) (HCC)   . High blood sugar   . High cholesterol   . Shortness of breath     Tobacco History: Social History   Tobacco Use  Smoking Status Every Day  . Current packs/day: 1.00  . Types: Cigarettes  Smokeless Tobacco Never   Ready to quit: Not Answered Counseling given: Not Answered   Continue to not smoke  Outpatient Encounter Medications as of 01/09/2024  Medication Sig  . albuterol  (VENTOLIN  HFA) 108 (90 Base) MCG/ACT inhaler INHALE 2 PUFFS INTO THE LUNGS FOUR TIMES DAILY AS NEEDED FOR WHEEZING OR SHORTNESS OF BREATH  . atorvastatin  (LIPITOR) 20 MG tablet Take 1 tablet (20 mg total) by mouth every evening.  . BREZTRI  AEROSPHERE 160-9-4.8 MCG/ACT AERO INHALE  2 PUFFS INTO THE LUNGS TWICE DAILY  . furosemide  (LASIX ) 20 MG tablet TAKE 1 TABLET(20 MG) BY MOUTH DAILY FOR 14 DAYS  . loratadine  (CLARITIN ) 10 MG tablet Take 1 tablet (10 mg total) by mouth daily.  . metFORMIN  (GLUCOPHAGE ) 500 MG tablet TAKE 1 TABLET(500 MG) BY MOUTH TWICE DAILY  . omeprazole  (PRILOSEC) 40 MG capsule Take 1 capsule (40 mg total) by mouth daily.   No facility-administered encounter medications on file as of 01/09/2024.     Review of Systems  Review of Systems   Physical Exam  BP (!) 151/82   Pulse 94   Temp 98.5 F (36.9 C) (Oral)   Ht 5' 6 (1.676 m)   Wt 207 lb 12.8 oz (94.3 kg)   SpO2 97%   BMI 33.54 kg/m   Wt Readings from Last 5 Encounters:  01/09/24 207 lb 12.8 oz (94.3 kg)  12/08/23 202 lb 3.2 oz (91.7 kg)  11/24/23 208 lb 6.4 oz (94.5 kg)  11/09/23 208 lb 6.4 oz (94.5 kg)  08/24/23 204 lb 9.6 oz (92.8 kg)    BMI Readings from Last 5 Encounters:  01/09/24 33.54 kg/m  12/08/23 30.74 kg/m  11/24/23 31.69 kg/m  11/09/23 31.69 kg/m  08/24/23 31.11 kg/m     Physical Exam    Assessment & Plan:   Dyspnea on exertion: Suspect large related to smoking-related lung disease and underlying possible asthma.  Emphysema and bronchial wall thickening seen on CT 10/2022 chest x-ray 04/2023 mildly hyperinflated.  No significant improvement with Breztri  but not using regularly.  Encouraged using 2 puffs twice a day every day.  Assess response.  PFTs for further evaluation.  Noisy inspiration, hoarse voice: Suspect related to smoking.  There is no stridor with breathing nor on auscultation.  Will assess PFTs.  Likely will need laryngology to evaluate.   Return in about 6 weeks (around 02/20/2024) for f/u Dr. Annella, after PFT same day as follow-up appointment with Dr. Annella.   Sheena JONELLE Annella, MD 01/09/2024   This appointment required *** minutes of patient care (this includes precharting, chart review, review of results, face-to-face  care, etc.).

## 2024-01-13 ENCOUNTER — Ambulatory Visit: Admitting: Physician Assistant

## 2024-03-08 ENCOUNTER — Ambulatory Visit: Admitting: Pulmonary Disease

## 2024-03-08 ENCOUNTER — Encounter: Payer: Self-pay | Admitting: Pulmonary Disease

## 2024-03-08 ENCOUNTER — Ambulatory Visit: Admitting: *Deleted

## 2024-03-08 VITALS — BP 124/60 | HR 109 | Ht 67.0 in | Wt 206.0 lb

## 2024-03-08 DIAGNOSIS — J432 Centrilobular emphysema: Secondary | ICD-10-CM | POA: Diagnosis not present

## 2024-03-08 DIAGNOSIS — R0609 Other forms of dyspnea: Secondary | ICD-10-CM

## 2024-03-08 LAB — PULMONARY FUNCTION TEST
DL/VA % pred: 89 %
DL/VA: 3.65 ml/min/mmHg/L
DLCO cor % pred: 69 %
DLCO cor: 15.2 ml/min/mmHg
DLCO unc % pred: 69 %
DLCO unc: 15.2 ml/min/mmHg
FEF 25-75 Post: 0.76 L/s
FEF 25-75 Pre: 0.67 L/s
FEF2575-%Change-Post: 13 %
FEF2575-%Pred-Post: 32 %
FEF2575-%Pred-Pre: 28 %
FEV1-%Change-Post: -13 %
FEV1-%Pred-Post: 37 %
FEV1-%Pred-Pre: 43 %
FEV1-Post: 1.03 L
FEV1-Pre: 1.18 L
FEV1FVC-%Change-Post: -18 %
FEV1FVC-%Pred-Pre: 58 %
FEV6-%Change-Post: 3 %
FEV6-%Pred-Post: 75 %
FEV6-%Pred-Pre: 73 %
FEV6-Post: 2.58 L
FEV6-Pre: 2.49 L
FEV6FVC-%Change-Post: 0 %
FEV6FVC-%Pred-Post: 102 %
FEV6FVC-%Pred-Pre: 102 %
FVC-%Change-Post: 6 %
FVC-%Pred-Post: 78 %
FVC-%Pred-Pre: 73 %
FVC-Post: 2.78 L
FVC-Pre: 2.61 L
Post FEV1/FVC ratio: 37 %
Post FEV6/FVC ratio: 98 %
Pre FEV1/FVC ratio: 45 %
Pre FEV6/FVC Ratio: 98 %
RV % pred: 108 %
RV: 2.43 L
TLC % pred: 97 %
TLC: 5.39 L

## 2024-03-08 NOTE — Progress Notes (Signed)
 Full PFT performed today.

## 2024-03-08 NOTE — Progress Notes (Signed)
 @Patient  ID: Sheena Arias, female    DOB: Nov 10, 1960, 63 y.o.   MRN: [id number]  Chief Complaint  Patient presents with  . Medical Management of Chronic Issues    Pt states post PFT     Referring provider: Medina-Vargas, Monina C*  HPI:   63 y.o. woman who were seen for evaluation of dyspnea on exertion.  Note from referring provider reviewed.  Patient has dyspnea on exertion for some time now.  Years.  Worse with inclines or stairs.  At rest is okay.  But she notes with talking she become short of breath.  She feels the need to take deep breaths with minimal exertion.  She has noisy breathing and hoarse voice.  This is developed over time.  But does seem like it started after thyroid  surgery some years ago.  No time of day when things are better or worse.  No position to make things better or worse.  No seasonal or environmental factors she can identify that make things better or worse.  She has been using Breztri .  She thinks it helps a little bit, not much.  Her most recent chest x-ray 04/2023 reveals clear lungs and mild hyperinflation on my review and interpretation.  Most recent CT scan of the lung 10/2022 reveals emphysematous changes, bronchial wall thickening, suspect both related to cigarette smoking, bronchial wall thickening could be related to underlying asthma on my review and interpretation.  Questionaires / Pulmonary Flowsheets:   ACT:      No data to display          MMRC:     No data to display          Epworth:      No data to display          Tests:   FENO:  No results found for: NITRICOXIDE  PFT:    Latest Ref Rng & Units 03/08/2024   10:43 AM  PFT Results  FVC-Pre L 2.61  P  FVC-Predicted Pre % 73  P  FVC-Post L 2.78  P  FVC-Predicted Post % 78  P  Pre FEV1/FVC % % 45  P  Post FEV1/FCV % % 37  P  FEV1-Pre L 1.18  P  FEV1-Predicted Pre % 43  P  FEV1-Post L 1.03  P  DLCO uncorrected ml/min/mmHg 15.20  P  DLCO UNC% % 69  P   DLCO corrected ml/min/mmHg 15.20  P  DLCO COR %Predicted % 69  P  DLVA Predicted % 89  P  TLC L 5.39  P  TLC % Predicted % 97  P  RV % Predicted % 108  P    P Preliminary result    WALK:      No data to display          Imaging: Personally reviewed and as per EMR and discussion this note No results found.   Lab Results: Personally reviewed CBC    Component Value Date/Time   WBC 8.5 08/24/2023 1142   RBC 4.28 08/24/2023 1142   HGB 12.4 08/24/2023 1142   HCT 37.5 08/24/2023 1142   PLT 271 08/24/2023 1142   MCV 87.6 08/24/2023 1142   MCH 29.0 08/24/2023 1142   MCHC 33.1 08/24/2023 1142   RDW 15.6 (H) 08/24/2023 1142   LYMPHSABS 1.6 08/31/2022 1206   MONOABS 0.5 08/31/2022 1206   EOSABS 51 08/24/2023 1142   BASOSABS 17 08/24/2023 1142    BMET    Component  Value Date/Time   NA 141 12/08/2023 0915   K 3.7 12/08/2023 0915   CL 102 12/08/2023 0915   CO2 28 12/08/2023 0915   GLUCOSE 114 12/08/2023 0915   BUN 15 12/08/2023 0915   CREATININE 1.32 (H) 12/08/2023 0915   CALCIUM  9.4 12/08/2023 0915   GFRNONAA 39 (L) 05/19/2023 0955   GFRAA 76 03/13/2008 0840    BNP No results found for: BNP  ProBNP No results found for: PROBNP  Specialty Problems       Pulmonary Problems   VIRAL URI   Qualifier: Diagnosis of  By: Krystal MD, Reyes A  Replacing diagnoses that were inactivated after the 07/26/22 regulatory import      EXTRINSIC ASTHMA, UNSPECIFIED   Qualifier: Diagnosis of  By: Krystal MD, Reyes A  Replacing diagnoses that were inactivated after the 07/26/22 regulatory import      Bronchitis    Allergies  Allergen Reactions  . Iohexol      Code: HIVES, Desc: SINGULAR HIVE LT CHEST S/P 13 HR PREP OF PREDNISONE  & BENADRYL//A.C.     Immunization History  Administered Date(s) Administered  . PFIZER Comirnaty(Gray Top)Covid-19 Tri-Sucrose Vaccine 09/10/2019, 10/01/2019  . PNEUMOCOCCAL CONJUGATE-20 12/08/2023  . Td 03/26/2004    Past  Medical History:  Diagnosis Date  . Bronchitis   . COPD (chronic obstructive pulmonary disease) (HCC)   . High blood sugar   . High cholesterol   . Shortness of breath     Tobacco History: Social History   Tobacco Use  Smoking Status Every Day  . Current packs/day: 1.00  . Types: Cigarettes  Smokeless Tobacco Never   Ready to quit: Not Answered Counseling given: Not Answered   Continue to not smoke  Outpatient Encounter Medications as of 03/08/2024  Medication Sig  . albuterol  (VENTOLIN  HFA) 108 (90 Base) MCG/ACT inhaler INHALE 2 PUFFS INTO THE LUNGS FOUR TIMES DAILY AS NEEDED FOR WHEEZING OR SHORTNESS OF BREATH  . atorvastatin  (LIPITOR) 20 MG tablet Take 1 tablet (20 mg total) by mouth every evening.  . BREZTRI  AEROSPHERE 160-9-4.8 MCG/ACT AERO INHALE 2 PUFFS INTO THE LUNGS TWICE DAILY  . furosemide  (LASIX ) 20 MG tablet TAKE 1 TABLET(20 MG) BY MOUTH DAILY FOR 14 DAYS  . loratadine  (CLARITIN ) 10 MG tablet Take 1 tablet (10 mg total) by mouth daily.  . metFORMIN  (GLUCOPHAGE ) 500 MG tablet TAKE 1 TABLET(500 MG) BY MOUTH TWICE DAILY  . omeprazole  (PRILOSEC) 40 MG capsule Take 1 capsule (40 mg total) by mouth daily.   No facility-administered encounter medications on file as of 03/08/2024.     Review of Systems  Review of Systems  No chest pain with exertion.  Orthopnea or PND.  Comprehensive review of systems otherwise negative. Physical Exam  BP 124/60   Pulse (!) 109   Ht 5' 7 (1.702 m)   Wt 206 lb (93.4 kg)   SpO2 97%   BMI 32.26 kg/m   Wt Readings from Last 5 Encounters:  03/08/24 206 lb (93.4 kg)  01/09/24 207 lb 12.8 oz (94.3 kg)  12/08/23 202 lb 3.2 oz (91.7 kg)  11/24/23 208 lb 6.4 oz (94.5 kg)  11/09/23 208 lb 6.4 oz (94.5 kg)    BMI Readings from Last 5 Encounters:  03/08/24 32.26 kg/m  01/09/24 33.54 kg/m  12/08/23 30.74 kg/m  11/24/23 31.69 kg/m  11/09/23 31.69 kg/m     Physical Exam General: Sitting in chair, no acute  distress Eyes: EOMI, no icterus Neck: Supple, no JVP, normal  breath sounds when trachea auscultated with inspiration and expiration Pulmonary: Clear, no work of breathing Cardiovascular: Warm, no edema Abdomen: Nondistended, bowel sounds present MSK: No synovitis, no joint effusion Neuro: Normal gait, no weakness Psych: Normal mood, full affect  Assessment & Plan:   Dyspnea on exertion: Suspect large related to smoking-related lung disease and underlying possible asthma.  Emphysema and bronchial wall thickening seen on CT 10/2022 chest x-ray 04/2023 mildly hyperinflated.  No significant improvement with Breztri  but not using regularly.  Encouraged using 2 puffs twice a day every day.  Assess response.  PFTs for further evaluation.  Noisy inspiration, hoarse voice: Suspect related to smoking.  There is no stridor with breathing nor on auscultation.  Will assess PFTs.  Likely will need laryngology to evaluate.  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  Smoking assessment and cessation counseling I have advised the patient to quit/stop smoking as soon as possible due to high risk for multiple medical problems.  It will also be very difficult for us  to manage patient's  respiratory symptoms and status if we continue to expose her lungs to a known irritant.  We do not advise e-cigarettes as a form of stopping smoking. Patient is willing to quit smoking. I have advised the patient that we can assist and have options of nicotine replacement therapy, provided smoking cessation education today, provided smoking cessation counseling, and provided cessation resources. Follow-up next office visit office visit for assessment of smoking cessation.  I spent 3 minutes in tobacco cessation counseling.   No follow-ups on file.   Sheena JONELLE Beals, MD 03/08/2024   This appointment required 64 minutes of patient care  (this includes precharting, chart review, review of results, face-to-face care, etc.).

## 2024-03-08 NOTE — Patient Instructions (Signed)
 Full PFT performed today.

## 2024-03-08 NOTE — Patient Instructions (Addendum)
 Nice to see you again   No changes to medication  Continue Breztri   Return to clinic in 6 months or sooner as needed with Dr. Annella

## 2024-03-08 NOTE — Progress Notes (Signed)
 @Patient  ID: Sheena Arias, female    DOB: 10/10/1960, 63 y.o.   MRN: [id number]  Chief Complaint  Patient presents with   Medical Management of Chronic Issues    Pt states post PFT     Referring provider: Medina-Vargas, Monina C*  HPI:   63 y.o. woman who were seen for evaluation of dyspnea on exertion.    Return for routine follow-up and discuss PFT performed today.  Overall doing well.  Symptoms stable.  Uses Breztri  mostly on days she works.  On that she does not work and is less active she often will not use it.  We discussed strategy of trying to use it every day for both daily and long-term benefit.  Had PFTs performed, this does demonstrate moderate to severe COPD.  No bronchodilator response.  Full interpretation below.  HPI at initial visit: Patient has dyspnea on exertion for some time now.  Years.  Worse with inclines or stairs.  At rest is okay.  But she notes with talking she become short of breath.  She feels the need to take deep breaths with minimal exertion.  She has noisy breathing and hoarse voice.  This is developed over time.  But does seem like it started after thyroid  surgery some years ago.  No time of day when things are better or worse.  No position to make things better or worse.  No seasonal or environmental factors she can identify that make things better or worse.  She has been using Breztri .  She thinks it helps a little bit, not much.  Her most recent chest x-ray 04/2023 reveals clear lungs and mild hyperinflation on my review and interpretation.  Most recent CT scan of the lung 10/2022 reveals emphysematous changes, bronchial wall thickening, suspect both related to cigarette smoking, bronchial wall thickening could be related to underlying asthma on my review and interpretation.  Questionaires / Pulmonary Flowsheets:   ACT:      No data to display          MMRC:     No data to display          Epworth:      No data to display           Tests:   FENO:  No results found for: NITRICOXIDE  PFT:    Latest Ref Rng & Units 03/08/2024   10:43 AM  PFT Results  FVC-Pre L 2.61  P  FVC-Predicted Pre % 73  P  FVC-Post L 2.78  P  FVC-Predicted Post % 78  P  Pre FEV1/FVC % % 45  P  Post FEV1/FCV % % 37  P  FEV1-Pre L 1.18  P  FEV1-Predicted Pre % 43  P  FEV1-Post L 1.03  P  DLCO uncorrected ml/min/mmHg 15.20  P  DLCO UNC% % 69  P  DLCO corrected ml/min/mmHg 15.20  P  DLCO COR %Predicted % 69  P  DLVA Predicted % 89  P  TLC L 5.39  P  TLC % Predicted % 97  P  RV % Predicted % 108  P    P Preliminary result  Personally reviewed interpreted as spirometry is consistent with severe fixed obstruction.  No significant bronchodilator response.  Lung volumes within normal notes.  DLCO cannot be interpreted given lack of quality, not reproducible.  WALK:      No data to display          Imaging: Personally reviewed and  as per EMR and discussion this note No results found.   Lab Results: Personally reviewed CBC    Component Value Date/Time   WBC 8.5 08/24/2023 1142   RBC 4.28 08/24/2023 1142   HGB 12.4 08/24/2023 1142   HCT 37.5 08/24/2023 1142   PLT 271 08/24/2023 1142   MCV 87.6 08/24/2023 1142   MCH 29.0 08/24/2023 1142   MCHC 33.1 08/24/2023 1142   RDW 15.6 (H) 08/24/2023 1142   LYMPHSABS 1.6 08/31/2022 1206   MONOABS 0.5 08/31/2022 1206   EOSABS 51 08/24/2023 1142   BASOSABS 17 08/24/2023 1142    BMET    Component Value Date/Time   NA 141 12/08/2023 0915   K 3.7 12/08/2023 0915   CL 102 12/08/2023 0915   CO2 28 12/08/2023 0915   GLUCOSE 114 12/08/2023 0915   BUN 15 12/08/2023 0915   CREATININE 1.32 (H) 12/08/2023 0915   CALCIUM  9.4 12/08/2023 0915   GFRNONAA 39 (L) 05/19/2023 0955   GFRAA 76 03/13/2008 0840    BNP No results found for: BNP  ProBNP No results found for: PROBNP  Specialty Problems       Pulmonary Problems   VIRAL URI   Qualifier: Diagnosis of  By: Krystal  MD, Reyes A  Replacing diagnoses that were inactivated after the 07/26/22 regulatory import      EXTRINSIC ASTHMA, UNSPECIFIED   Qualifier: Diagnosis of  By: Krystal MD, Reyes A  Replacing diagnoses that were inactivated after the 07/26/22 regulatory import      Bronchitis    Allergies  Allergen Reactions   Iohexol      Code: HIVES, Desc: SINGULAR HIVE LT CHEST S/P 13 HR PREP OF PREDNISONE  & BENADRYL//A.C.     Immunization History  Administered Date(s) Administered   PFIZER Comirnaty(Gray Top)Covid-19 Tri-Sucrose Vaccine 09/10/2019, 10/01/2019   PNEUMOCOCCAL CONJUGATE-20 12/08/2023   Td 03/26/2004    Past Medical History:  Diagnosis Date   Bronchitis    COPD (chronic obstructive pulmonary disease) (HCC)    High blood sugar    High cholesterol    Shortness of breath     Tobacco History: Social History   Tobacco Use  Smoking Status Every Day   Current packs/day: 1.00   Types: Cigarettes  Smokeless Tobacco Never   Ready to quit: Not Answered Counseling given: Not Answered   Continue to not smoke  Outpatient Encounter Medications as of 03/08/2024  Medication Sig   albuterol  (VENTOLIN  HFA) 108 (90 Base) MCG/ACT inhaler INHALE 2 PUFFS INTO THE LUNGS FOUR TIMES DAILY AS NEEDED FOR WHEEZING OR SHORTNESS OF BREATH   atorvastatin  (LIPITOR) 20 MG tablet Take 1 tablet (20 mg total) by mouth every evening.   BREZTRI  AEROSPHERE 160-9-4.8 MCG/ACT AERO INHALE 2 PUFFS INTO THE LUNGS TWICE DAILY   furosemide  (LASIX ) 20 MG tablet TAKE 1 TABLET(20 MG) BY MOUTH DAILY FOR 14 DAYS   loratadine  (CLARITIN ) 10 MG tablet Take 1 tablet (10 mg total) by mouth daily.   metFORMIN  (GLUCOPHAGE ) 500 MG tablet TAKE 1 TABLET(500 MG) BY MOUTH TWICE DAILY   omeprazole  (PRILOSEC) 40 MG capsule Take 1 capsule (40 mg total) by mouth daily.   No facility-administered encounter medications on file as of 03/08/2024.     Review of Systems  Review of Systems  N/a   Physical Exam  BP 124/60    Pulse (!) 109   Ht 5' 7 (1.702 m)   Wt 206 lb (93.4 kg)   SpO2 97%   BMI 32.26 kg/m  Wt Readings from Last 5 Encounters:  03/08/24 206 lb (93.4 kg)  01/09/24 207 lb 12.8 oz (94.3 kg)  12/08/23 202 lb 3.2 oz (91.7 kg)  11/24/23 208 lb 6.4 oz (94.5 kg)  11/09/23 208 lb 6.4 oz (94.5 kg)    BMI Readings from Last 5 Encounters:  03/08/24 32.26 kg/m  01/09/24 33.54 kg/m  12/08/23 30.74 kg/m  11/24/23 31.69 kg/m  11/09/23 31.69 kg/m     Physical Exam General: Sitting in chair, no acute distress Eyes: EOMI, no icterus Neck: No JVP Pulmonary: Distant, clear to auscultation Cardiovascular: Warm no edema Abdomen: Nondistended   Assessment & Plan:   Dyspnea on exertion: Large related to COPD demonstrate on PFT 02/2024.  With emphysema.  COPD: Severe based on FEV1.  No air trapping or hyperinflation on lung volumes.  Continue Breztri  for significant symptoms of dyspnea.  Encouraged twice daily use.  She benefits, improve breathing when she does use it.  Noisy inspiration, hoarse voice: No stridor.  Previous CT shows no evidence of tracheal stenosis.  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  Smoking assessment and cessation counseling I have advised the patient to quit/stop smoking as soon as possible due to high risk for multiple medical problems.  It will also be very difficult for us  to manage patient's  respiratory symptoms and status if we continue to expose her lungs to a known irritant.  We do not advise e-cigarettes as a form of stopping smoking. Patient is willing to quit smoking. I have advised the patient that we can assist and have options of nicotine replacement therapy, provided smoking cessation education today, provided smoking cessation counseling, and provided cessation resources. She has cut back. She is encouraged to gradually reduce cigarettes to 0. Follow-up next office visit office  visit for assessment of smoking cessation.   Return in about 6 months (around 09/05/2024) for f/u Dr. Annella.   Donnice JONELLE Annella, MD 03/08/2024

## 2024-03-09 ENCOUNTER — Ambulatory Visit: Admitting: Adult Health

## 2024-03-16 ENCOUNTER — Ambulatory Visit: Payer: Self-pay | Admitting: Adult Health

## 2024-08-02 ENCOUNTER — Other Ambulatory Visit (HOSPITAL_BASED_OUTPATIENT_CLINIC_OR_DEPARTMENT_OTHER)
